# Patient Record
Sex: Male | Born: 1975
Health system: Southern US, Community
[De-identification: ages and names within clinical notes are randomized; demographics above are authoritative.]

## PROBLEM LIST (undated history)

## (undated) DIAGNOSIS — B1920 Unspecified viral hepatitis C without hepatic coma: Secondary | ICD-10-CM

## (undated) DIAGNOSIS — K219 Gastro-esophageal reflux disease without esophagitis: Secondary | ICD-10-CM

## (undated) DIAGNOSIS — N433 Hydrocele, unspecified: Secondary | ICD-10-CM

## (undated) DIAGNOSIS — K7460 Unspecified cirrhosis of liver: Secondary | ICD-10-CM

## (undated) HISTORY — PX: INGUINAL HERNIA REPAIR: SUR1180

## (undated) HISTORY — DX: Unspecified viral hepatitis C without hepatic coma: B19.20

## (undated) HISTORY — DX: Unspecified cirrhosis of liver: K74.60

---

## 2002-10-20 ENCOUNTER — Emergency Department (HOSPITAL_COMMUNITY): Admission: EM | Admit: 2002-10-20 | Discharge: 2002-10-20 | Payer: Self-pay | Admitting: Emergency Medicine

## 2002-10-21 ENCOUNTER — Emergency Department (HOSPITAL_COMMUNITY): Admission: EM | Admit: 2002-10-21 | Discharge: 2002-10-21 | Payer: Self-pay | Admitting: Emergency Medicine

## 2002-10-21 ENCOUNTER — Encounter: Payer: Self-pay | Admitting: Emergency Medicine

## 2004-07-22 ENCOUNTER — Emergency Department (HOSPITAL_COMMUNITY): Admission: EM | Admit: 2004-07-22 | Discharge: 2004-07-22 | Payer: Self-pay | Admitting: Emergency Medicine

## 2004-10-29 ENCOUNTER — Emergency Department (HOSPITAL_COMMUNITY): Admission: EM | Admit: 2004-10-29 | Discharge: 2004-10-29 | Payer: Self-pay | Admitting: Emergency Medicine

## 2004-11-02 ENCOUNTER — Emergency Department (HOSPITAL_COMMUNITY): Admission: EM | Admit: 2004-11-02 | Discharge: 2004-11-02 | Payer: Self-pay | Admitting: Emergency Medicine

## 2008-08-07 ENCOUNTER — Emergency Department (HOSPITAL_COMMUNITY): Admission: EM | Admit: 2008-08-07 | Discharge: 2008-08-07 | Payer: Self-pay | Admitting: Emergency Medicine

## 2008-08-10 ENCOUNTER — Emergency Department (HOSPITAL_COMMUNITY): Admission: EM | Admit: 2008-08-10 | Discharge: 2008-08-10 | Payer: Self-pay | Admitting: Family Medicine

## 2009-02-27 ENCOUNTER — Emergency Department (HOSPITAL_COMMUNITY): Admission: EM | Admit: 2009-02-27 | Discharge: 2009-02-27 | Payer: Self-pay | Admitting: Emergency Medicine

## 2009-06-26 ENCOUNTER — Emergency Department (HOSPITAL_COMMUNITY): Admission: EM | Admit: 2009-06-26 | Discharge: 2009-06-26 | Payer: Self-pay | Admitting: Emergency Medicine

## 2015-05-24 ENCOUNTER — Encounter (HOSPITAL_COMMUNITY): Payer: Self-pay

## 2015-05-24 ENCOUNTER — Emergency Department (HOSPITAL_COMMUNITY)
Admission: EM | Admit: 2015-05-24 | Discharge: 2015-05-24 | Disposition: A | Discharge status: Home / Self Care | Payer: Self-pay | Attending: Emergency Medicine | Admitting: Emergency Medicine

## 2015-05-24 DIAGNOSIS — Y939 Activity, unspecified: Secondary | ICD-10-CM | POA: Insufficient documentation

## 2015-05-24 DIAGNOSIS — S41112A Laceration without foreign body of left upper arm, initial encounter: Secondary | ICD-10-CM

## 2015-05-24 DIAGNOSIS — Y999 Unspecified external cause status: Secondary | ICD-10-CM | POA: Insufficient documentation

## 2015-05-24 DIAGNOSIS — W268XXA Contact with other sharp object(s), not elsewhere classified, initial encounter: Secondary | ICD-10-CM | POA: Insufficient documentation

## 2015-05-24 DIAGNOSIS — S51812A Laceration without foreign body of left forearm, initial encounter: Secondary | ICD-10-CM | POA: Insufficient documentation

## 2015-05-24 DIAGNOSIS — Y929 Unspecified place or not applicable: Secondary | ICD-10-CM | POA: Insufficient documentation

## 2015-05-24 DIAGNOSIS — Z720 Tobacco use: Secondary | ICD-10-CM | POA: Insufficient documentation

## 2015-05-24 MED ORDER — LIDOCAINE-EPINEPHRINE (PF) 2 %-1:200000 IJ SOLN
10.0000 mL | Freq: Once | INTRAMUSCULAR | Status: AC
  Administered 2015-05-24: 10 mL
  Filled 2015-05-24: qty 20
  Filled 2015-05-24: qty 10

## 2015-05-24 MED ORDER — TETANUS-DIPHTH-ACELL PERTUSSIS 5-2.5-18.5 LF-MCG/0.5 IM SUSP
0.5000 mL | Freq: Once | INTRAMUSCULAR | Status: AC
  Administered 2015-05-24: 0.5 mL via INTRAMUSCULAR
  Filled 2015-05-24: qty 0.5

## 2015-05-24 NOTE — ED Provider Notes (Signed)
CSN: [id number]     Arrival date & time 05/24/15  1234 History  By signing my name below, I, Justin Newman, attest that this documentation has been prepared under the direction and in the presence of non-physician practitioner, Starsky Nanna Camprubi-Soms, PA-C. Electronically Signed: [name], Scribe. 05/24/2015. 1:22 PM.   Chief Complaint  Patient presents with  . Extremity Laceration   Patient is a 40 y.o. male presenting with skin laceration. The history is provided by the patient. No language interpreter was used.  Laceration Location:  Shoulder/arm Shoulder/arm laceration location:  L forearm Depth:  Cutaneous Quality: straight   Bleeding: controlled   Time since incident:  3 hours Laceration mechanism:  Metal edge Pain details:    Quality:  Burning   Severity:  Mild   Timing:  Constant   Progression:  Unchanged Foreign body present:  No foreign bodies Relieved by:  None tried Worsened by:  Pressure Ineffective treatments:  None tried Tetanus status:  Unknown HPI Comments:  Justin Newman is a 40 y.o. otherwise healthy male who presents to the Emergency Department complaining of 3/10 constant, stinging nonradiating pain over 2 lacerations to left anterior forearm. Pain worsens with palpation to the area. Pt was pulling vines and caught his arm on barbed wire 2.5 hours ago. Denies fall. No alleviating factors noted or treatment attempted PTA. Bleeding controlled PTA with pressure. Pt reports reconstructive surgery on left arm in 2012. He is unsure of last Tetanus. Denies use of blood thinners or any bleeding conditions, numbness, tingling, focal weakness, chest pain, shortness of breath, nausea, vomiting, abdominal pain, dysuria, hematuria, rash, body aches, or muscle pain.  History reviewed. No pertinent past medical history. Past Surgical History  Procedure Laterality Date  . Hernia repair     History reviewed. No pertinent family history. Social History  Substance Use  Topics  . Smoking status: Current Every Day Smoker  . Smokeless tobacco: None  . Alcohol Use: No    Review of Systems  Respiratory: Negative for shortness of breath.   Cardiovascular: Negative for chest pain.  Gastrointestinal: Negative for nausea, vomiting and abdominal pain.  Genitourinary: Negative for dysuria and hematuria.  Musculoskeletal: Positive for myalgias (at wounds on L arm). Negative for arthralgias.  Skin: Positive for wound (laceration). Negative for color change and rash.  Allergic/Immunologic: Negative for immunocompromised state.  Neurological: Negative for weakness and numbness.  Hematological: Does not bruise/bleed easily.  Psychiatric/Behavioral: Negative for confusion.  10 Systems reviewed and all are negative for acute change except as noted in the HPI.  Allergies  Review of patient's allergies indicates no known allergies.  Home Medications   Prior to Admission medications   Not on File   BP 127/90 mmHg  Pulse 78  Temp(Src) 98.9 F (37.2 C) (Oral)  Resp 16  SpO2 97%   Physical Exam  Constitutional: He is oriented to person, place, and time. Vital signs are normal. He appears well-developed and well-nourished.  Non-toxic appearance. No distress.  Afebrile, nontoxic, NAD  HENT:  Head: Normocephalic and atraumatic.  Mouth/Throat: Mucous membranes are normal.  Eyes: Conjunctivae and EOM are normal. Right eye exhibits no discharge. Left eye exhibits no discharge.  Neck: Normal range of motion. Neck supple.  Cardiovascular: Normal rate and intact distal pulses.   Pulmonary/Chest: Effort normal. No respiratory distress.  Abdominal: Normal appearance. He exhibits no distension.  Musculoskeletal: Normal range of motion.       Left forearm: He exhibits laceration. He exhibits no tenderness, no bony  tenderness and no swelling.  Left wrist, elbow and hand with FROM intact, no focal bony tenderness to the left upper extremity, with 2 lacerations superficial  to volar aspect of forearm as pictured below. No retained foreign bodies, no ongoing bleeding. Strength and sensation grossly intact, distal puses intact, soft compartments.  Neurological: He is alert and oriented to person, place, and time. He has normal strength. No sensory deficit.  Skin: Skin is warm and dry. Laceration noted. No rash noted.  2 left arm lacs as noted above and picture below   Psychiatric: He has a normal mood and affect.  Nursing note and vitals reviewed.     ED Course  .Marland KitchenLaceration Repair Date/Time: 05/24/2015 1:34 PM Performed by: Zacarias Pontes Authorized by: Zacarias Pontes Consent: Verbal consent obtained. Risks and benefits: risks, benefits and alternatives were discussed Consent given by: patient Patient understanding: patient states understanding of the procedure being performed Patient consent: the patient's understanding of the procedure matches consent given Patient identity confirmed: verbally with patient Body area: upper extremity Location details: left lower arm Wound length (cm): one lac ~3cm, one lac ~1.5cm. Foreign bodies: no foreign bodies Tendon involvement: none Nerve involvement: none Vascular damage: no Local anesthetic: lidocaine 2% with epinephrine Anesthetic total: 2 ml Patient sedated: no Preparation: Patient was prepped and draped in the usual sterile fashion. Irrigation solution: saline Irrigation method: syringe Amount of cleaning: extensive Debridement: none Degree of undermining: none Skin closure: 5-0 Prolene Number of sutures: 6 Technique: simple and horizontal mattress Approximation: close Approximation difficulty: simple Dressing: 4x4 sterile gauze and antibiotic ointment Patient tolerance: Patient tolerated the procedure well with no immediate complications    DIAGNOSTIC STUDIES:  Oxygen Saturation is 97% on RA, normal by my interpretation.    COORDINATION OF CARE:  1:10 PM Will update  Tetanus and set up for laceration repair. Discussed treatment plan with pt at bedside and pt agreed to plan.  1:34 PM Laceration repair: 2 mL lidocaine; 5 o'prolene; 6 total, 4 and 2  Labs Review Labs Reviewed - No data to display  Imaging Review No results found. I have personally reviewed and evaluated these images and lab results as part of my medical decision-making.   EKG Interpretation None      MDM   Final diagnoses:  Arm laceration, left, initial encounter  Tobacco use    40 y.o. male here with superficial lacs to L forearm. NVI with soft compartments, FROM intact in extremity distal to the wounds. No FBs visualized, and wound bed easily examined. Doubt need for xray imaging. Will update tetanus since he's not sure when his last one was. Will repair. Declines wanting pain meds.   1:50 PM Suture repair done, one lac required 1 horizontal mattress suture and 3 simple interrupted sutures; other lac required 2 simple interrupted sutures. Wound edges well approximated. Discussed proper wound care. Smoking cessation encouraged. Doubt need for empiric abx. F/up with PCP or [facility] in 7-10 days for suture removal and recheck of wound. RICE and tylenol/motrin for pain. I explained the diagnosis and have given explicit precautions to return to the ER including for any other new or worsening symptoms. The patient understands and accepts the medical plan as it's been dictated and I have answered their questions. Discharge instructions concerning home care and prescriptions have been given. The patient is STABLE and is discharged to home in good condition.   I personally performed the services described in this documentation, which was scribed in my presence. The  recorded information has been reviewed and is accurate.  BP 127/90 mmHg  Pulse 78  Temp(Src) 98.9 F (37.2 C) (Oral)  Resp 16  SpO2 97%  Meds ordered this encounter  Medications  . lidocaine-EPINEPHrine (XYLOCAINE W/EPI) 2  %-1:200000 (PF) injection 10 mL    Sig:   . Tdap (BOOSTRIX) injection 0.5 mL    Sig:      Tonga Prout Camprubi-Soms, PA-C 05/24/15 [place], MD 05/30/15 [phone]

## 2015-05-24 NOTE — Progress Notes (Addendum)
Pt has two lacerations to the inner aspect of his left forearm. Both wounds irrigated with NSS and 4X4 's placed. Pt tolerated. Positive radial pulse and pt has full ROM of his arm. Lacerations about 6 inches in length. Minimal exudate from both sites. PA in  With the pt to suture. (1:32pm )

## 2015-05-24 NOTE — Discharge Instructions (Signed)
Keep wound clean with mild soap and water. Keep area covered with a topical antibiotic ointment and bandage, keep bandage dry, and do not submerge in water for 24 hours. Ice and elevate for additional pain relief and swelling. Alternate between Ibuprofen and Tylenol for additional pain relief. STOP SMOKING! Follow up with your primary care doctor or the [facility] [place] in approximately 7-10 days for wound recheck and suture removal. Monitor area for signs of infection to include, but not limited to: increasing pain, spreading redness, drainage/pus, worsening swelling, or fevers. Return to emergency department for emergent changing or worsening symptoms.    Laceration Care, Adult A laceration is a cut that goes through all layers of the skin. The cut also goes into the tissue that is right under the skin. Some cuts heal on their own. Others need to be closed with stitches (sutures), staples, skin adhesive strips, or wound glue. Taking care of your cut lowers your risk of infection and helps your cut to heal better. HOW TO TAKE CARE OF YOUR CUT For stitches or staples:  Keep the wound clean and dry.  If you were given a bandage (dressing), you should change it at least one time per day or as told by your doctor. You should also change it if it gets wet or dirty.  Keep the wound completely dry for the first 24 hours or as told by your doctor. After that time, you may take a shower or a bath. However, make sure that the wound is not soaked in water until after the stitches or staples have been removed.  Clean the wound one time each day or as told by your doctor:  Wash the wound with soap and water.  Rinse the wound with water until all of the soap comes off.  Pat the wound dry with a clean towel. Do not rub the wound.  After you clean the wound, put a thin layer of antibiotic ointment on it as told by your doctor. This ointment:  Helps to prevent infection.  Keeps the bandage  from sticking to the wound.  Have your stitches or staples removed as told by your doctor. If your doctor used skin adhesive strips:   Keep the wound clean and dry.  If you were given a bandage, you should change it at least one time per day or as told by your doctor. You should also change it if it gets dirty or wet.  Do not get the skin adhesive strips wet. You can take a shower or a bath, but be careful to keep the wound dry.  If the wound gets wet, pat it dry with a clean towel. Do not rub the wound.  Skin adhesive strips fall off on their own. You can trim the strips as the wound heals. Do not remove any strips that are still stuck to the wound. They will fall off after a while. If your doctor used wound glue:  Try to keep your wound dry, but you may briefly wet it in the shower or bath. Do not soak the wound in water, such as by swimming.  After you take a shower or a bath, gently pat the wound dry with a clean towel. Do not rub the wound.  Do not do any activities that will make you really sweaty until the skin glue has fallen off on its own.  Do not apply liquid, cream, or ointment medicine to your wound while the skin glue is  still on.  If you were given a bandage, you should change it at least one time per day or as told by your doctor. You should also change it if it gets dirty or wet.  If a bandage is placed over the wound, do not let the tape for the bandage touch the skin glue.  Do not pick at the glue. The skin glue usually stays on for 5-10 days. Then, it falls off of the skin. General Instructions  To help prevent scarring, make sure to cover your wound with sunscreen whenever you are outside after stitches are removed, after adhesive strips are removed, or when wound glue stays in place and the wound is healed. Make sure to wear a sunscreen of at least 30 SPF.  Take over-the-counter and prescription medicines only as told by your doctor.  If you were given  antibiotic medicine or ointment, take or apply it as told by your doctor. Do not stop using the antibiotic even if your wound is getting better.  Do not scratch or pick at the wound.  Keep all follow-up visits as told by your doctor. This is important.  Check your wound every day for signs of infection. Watch for:  Redness, swelling, or pain.  Fluid, blood, or pus.  Raise (elevate) the injured area above the level of your heart while you are sitting or lying down, if possible. GET HELP IF:  You got a tetanus shot and you have any of these problems at the injection site:  Swelling.  Very bad pain.  Redness.  Bleeding.  You have a fever.  A wound that was closed breaks open.  You notice a bad smell coming from your wound or your bandage.  You notice something coming out of the wound, such as wood or glass.  Medicine does not help your pain.  You have more redness, swelling, or pain at the site of your wound.  You have fluid, blood, or pus coming from your wound.  You notice a change in the color of your skin near your wound.  You need to change the bandage often because fluid, blood, or pus is coming from the wound.  You start to have a new rash.  You start to have numbness around the wound. GET HELP RIGHT AWAY IF:  You have very bad swelling around the wound.  Your pain suddenly gets worse and is very bad.  You notice painful lumps near the wound or on skin that is anywhere on your body.  You have a red streak going away from your wound.  The wound is on your hand or foot and you cannot move a finger or toe like you usually can.  The wound is on your hand or foot and you notice that your fingers or toes look pale or bluish.   This information is not intended to replace advice given to you by your health care provider. Make sure you discuss any questions you have with your health care provider.   Document Released: 06/10/2007 Document Revised: 05/08/2014  Document Reviewed: 12/18/2013 Elsevier Interactive Patient Education 2016 [place], or Adhesive Wound Closure Doctors use stitches (sutures), staples, and certain glue (skin adhesives) to hold your skin together while it heals (wound closure). You may need this treatment after you have surgery or if you cut your skin accidentally. These methods help your skin heal more quickly. They also make it less likely that you will have a scar. WHAT ARE  THE DIFFERENT KINDS OF WOUND CLOSURES? There are many options for wound closure. The one that your doctor uses depends on how deep and large your wound is. Adhesive Glue To use this glue to close a wound, your doctor holds the edges of the wound together and paints the glue on the surface of your skin. You may need more than one layer of glue. Then the wound may be covered with a light bandage (dressing). This type of skin closure may be used for small wounds that are not deep (superficial). Using glue for wound closure is less painful than other methods. It does not require a medicine that numbs the area. This method also leaves nothing to be removed. Adhesive glue is often used for children and on facial wounds. Adhesive glue cannot be used for wounds that are deep, uneven, or bleeding. It is not used inside of a wound.  Adhesive Strips These strips are made of sticky (adhesive), porous paper. They are placed across your skin edges like a regular adhesive bandage. You leave them on until they fall off. Adhesive strips may be used to close very superficial wounds. They may also be used along with sutures to improve closure of your skin edges.  Sutures Sutures are the oldest method of wound closure. Sutures can be made from natural or synthetic materials. They can be made from a material that your body can break down as your wound heals (absorbable), or they can be made from a material that needs to be removed from your skin  (nonabsorbable). They come in many different strengths and sizes. Your doctor attaches the sutures to a steel needle on one end. Sutures can be passed through your skin, or through the tissues beneath your skin. Then they are tied and cut. Your skin edges may be closed in one continuous stitch or in separate stitches. Sutures are strong and can be used for all kinds of wounds. Absorbable sutures may be used to close tissues under the skin. The disadvantage of sutures is that they may cause skin reactions that lead to infection. Nonabsorbable sutures need to be removed. Staples When surgical staples are used to close a wound, the edges of your skin on both sides of the wound are brought close together. A staple is placed across the wound, and an instrument secures the edges together. Staples are often used to close surgical cuts (incisions). Staples are faster to use than sutures, and they cause less reaction from your skin. Staples need to be removed using a tool that bends the staples away from your skin. HOW DO I CARE FOR MY WOUND CLOSURE?  Take medicines only as told by your doctor.  If you were prescribed an antibiotic medicine for your wound, finish it all even if you start to feel better.  Use ointments or creams only as told by your doctor.  Wash your hands with soap and water before and after touching your wound.  Do not soak your wound in water. Do not take baths, swim, or use a hot tub until your doctor says it is okay.  Ask your doctor when you can start showering. Cover your wound if told by your doctor.  Do not take out your own sutures or staples.  Do not pick at your wound. Picking can cause an infection.  Keep all follow-up visits as told by your doctor. This is important. HOW LONG WILL I HAVE MY WOUND CLOSURE?   Leave adhesive glue on your skin until  the glue peels away.  Leave adhesive strips on your skin until they fall off.  Absorbable sutures will dissolve within  several days.  Nonabsorbable sutures and staples must be removed. The location of the wound will determine how long they stay in. This can range from several days to a couple of weeks. WHEN SHOULD I SEEK HELP FOR MY WOUND CLOSURE? Contact your doctor if:  You have a fever.  You have chills.  You have redness, puffiness (swelling), or pain at the site of your wound.  You have fluid, blood, or pus coming from your wound.  There is a bad smell coming from your wound.  The skin edges of your wound start to separate after your sutures have been removed.  Your wound becomes thick, raised, and darker in color after your sutures come out (scarring).   This information is not intended to replace advice given to you by your health care provider. Make sure you discuss any questions you have with your health care provider.   Document Released: 10/19/2008 Document Revised: 01/12/2014 Document Reviewed: 05/31/2013 Elsevier Interactive Patient Education 2016 [place] Sutures are stitches that can be used to close wounds. Taking care of your wound properly can help prevent pain and infection. It can also help your wound to heal more quickly. HOW TO CARE FOR YOUR SUTURED WOUND Wound Care  Keep the wound clean and dry.  If you were given a bandage (dressing), change it at least one time per day or as told by your doctor. You should also change it if it gets wet or dirty.  Keep the wound completely dry for the first 24 hours or as told by your doctor. After that time, you may shower or bathe. However, make sure that the wound is not soaked in water until the sutures have been removed.  Clean the wound one time each day or as told by your doctor.  Wash the wound with soap and water.  Rinse the wound with water to remove all soap.  Pat the wound dry with a clean towel. Do not rub the wound.  After cleaning the wound, put a thin layer of antibiotic ointment on it as  told your doctor. This ointment:  Helps to prevent infection.  Keeps the bandage from sticking to the wound.  Have the sutures removed as told by your doctor. General Instructions  Take or apply medicines only as told by your doctor.  To help prevent scarring, make sure to cover your wound with sunscreen whenever you are outside after the sutures are removed and the wound is healed. Make sure to wear a sunscreen of at least 30 SPF.  If you were prescribed an antibiotic medicine or ointment, finish all of it even if you start to feel better.  Do not scratch or pick at the wound.  Keep all follow-up visits as told by your doctor. This is important.  Check your wound every day for signs of infection. Watch for:  Redness, swelling, or pain.  Fluid, blood, or pus.  Raise (elevate) the injured area above the level of your heart while you are sitting or lying down, if possible.  Avoid stretching your wound.  Drink enough fluids to keep your pee (urine) clear or pale yellow. GET HELP IF:  You were given a tetanus shot and you have any of these where the needle went in:  Swelling.  Very bad pain.  Redness.  Bleeding.  You have  a fever.  A wound that was closed breaks open.  You notice a bad smell coming from the wound.  You notice something coming out of the wound, such as wood or glass.  Medicine does not help your pain.  You have any of these at the site of the wound.  More redness.  More swelling.  More pain.  You have any of these coming from the wound.  Fluid.  Blood.  Pus.  You notice a change in the color of your skin near the wound.  You need to change the bandage often due to fluid, blood, or pus coming from the wound.  You have a new rash.  You have numbness around the wound. GET HELP RIGHT AWAY IF:  You have very bad swelling around the wound.  Your pain suddenly gets worse and is very bad.  You have painful lumps near the wound or on  skin that is anywhere on your body.  You have a red streak going away from the wound.  The wound is on your hand or foot and you cannot move a finger or toe like normal.  The wound is on your hand or foot and you notice that your fingers or toes look pale or bluish.   This information is not intended to replace advice given to you by your health care provider. Make sure you discuss any questions you have with your health care provider.   Document Released: 06/10/2007 Document Revised: 05/08/2014 Document Reviewed: 08/03/2012 Elsevier Interactive Patient Education 2016 [employer].  Smoking Cessation, Tips for Success If you are ready to quit smoking, congratulations! You have chosen to help yourself be healthier. Cigarettes bring nicotine, tar, carbon monoxide, and other irritants into your body. Your lungs, heart, and blood vessels will be able to work better without these poisons. There are many different ways to quit smoking. Nicotine gum, nicotine patches, a nicotine inhaler, or nicotine nasal spray can help with physical craving. Hypnosis, support groups, and medicines help break the habit of smoking. WHAT THINGS CAN I DO TO MAKE QUITTING EASIER?  Here are some tips to help you quit for good:  Pick a date when you will quit smoking completely. Tell all of your friends and family about your plan to quit on that date.  Do not try to slowly cut down on the number of cigarettes you are smoking. Pick a quit date and quit smoking completely starting on that day.  Throw away all cigarettes.   Clean and remove all ashtrays from your home, work, and car.  On a card, write down your reasons for quitting. Carry the card with you and read it when you get the urge to smoke.  Cleanse your body of nicotine. Drink enough water and fluids to keep your urine clear or pale yellow. Do this after quitting to flush the nicotine from your body.  Learn to predict your moods. Do not let a bad situation  be your excuse to have a cigarette. Some situations in your life might tempt you into wanting a cigarette.  Never have "just one" cigarette. It leads to wanting another and another. Remind yourself of your decision to quit.  Change habits associated with smoking. If you smoked while driving or when feeling stressed, try other activities to replace smoking. Stand up when drinking your coffee. Brush your teeth after eating. Sit in a different chair when you read the paper. Avoid alcohol while trying to quit, and try to drink fewer  caffeinated beverages. Alcohol and caffeine may urge you to smoke.  Avoid foods and drinks that can trigger a desire to smoke, such as sugary or spicy foods and alcohol.  Ask people who smoke not to smoke around you.  Have something planned to do right after eating or having a cup of coffee. For example, plan to take a walk or exercise.  Try a relaxation exercise to calm you down and decrease your stress. Remember, you may be tense and nervous for the first 2 weeks after you quit, but this will pass.  Find new activities to keep your hands busy. Play with a pen, coin, or rubber band. Doodle or draw things on paper.  Brush your teeth right after eating. This will help cut down on the craving for the taste of tobacco after meals. You can also try mouthwash.   Use oral substitutes in place of cigarettes. Try using lemon drops, carrots, cinnamon sticks, or chewing gum. Keep them handy so they are available when you have the urge to smoke.  When you have the urge to smoke, try deep breathing.  Designate your home as a nonsmoking area.  If you are a heavy smoker, ask your health care provider about a prescription for nicotine chewing gum. It can ease your withdrawal from nicotine.  Reward yourself. Set aside the cigarette money you save and buy yourself something nice.  Look for support from others. Join a support group or smoking cessation program. Ask someone at home  or at work to help you with your plan to quit smoking.  Always ask yourself, "Do I need this cigarette or is this just a reflex?" Tell yourself, "Today, I choose not to smoke," or "I do not want to smoke." You are reminding yourself of your decision to quit.  Do not replace cigarette smoking with electronic cigarettes (commonly called e-cigarettes). The safety of e-cigarettes is unknown, and some may contain harmful chemicals.  If you relapse, do not give up! Plan ahead and think about what you will do the next time you get the urge to smoke. HOW WILL I FEEL WHEN I QUIT SMOKING? You may have symptoms of withdrawal because your body is used to nicotine (the addictive substance in cigarettes). You may crave cigarettes, be irritable, feel very hungry, cough often, get headaches, or have difficulty concentrating. The withdrawal symptoms are only temporary. They are strongest when you first quit but will go away within 10-14 days. When withdrawal symptoms occur, stay in control. Think about your reasons for quitting. Remind yourself that these are signs that your body is healing and getting used to being without cigarettes. Remember that withdrawal symptoms are easier to treat than the major diseases that smoking can cause.  Even after the withdrawal is over, expect periodic urges to smoke. However, these cravings are generally short lived and will go away whether you smoke or not. Do not smoke! WHAT RESOURCES ARE AVAILABLE TO HELP ME QUIT SMOKING? Your health care provider can direct you to community resources or hospitals for support, which may include:  Group support.  Education.  Hypnosis.  Therapy.   This information is not intended to replace advice given to you by your health care provider. Make sure you discuss any questions you have with your health care provider.   Document Released: 09/20/2003 Document Revised: 01/12/2014 Document Reviewed: 06/09/2012 Elsevier Interactive Patient  Education [employer].

## 2015-05-24 NOTE — ED Notes (Signed)
2 lacerations to left anterior forearm.  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  ? Tetanus in 2012

## 2015-06-27 ENCOUNTER — Emergency Department (HOSPITAL_COMMUNITY)
Admission: EM | Admit: 2015-06-27 | Discharge: 2015-06-27 | Disposition: A | Discharge status: Home / Self Care | Payer: MEDICAID | Attending: Dermatology | Admitting: Dermatology

## 2015-06-27 ENCOUNTER — Encounter (HOSPITAL_COMMUNITY): Payer: Self-pay | Admitting: Nurse Practitioner

## 2015-06-27 DIAGNOSIS — N50812 Left testicular pain: Secondary | ICD-10-CM | POA: Insufficient documentation

## 2015-06-27 DIAGNOSIS — Z5321 Procedure and treatment not carried out due to patient leaving prior to being seen by health care provider: Secondary | ICD-10-CM | POA: Insufficient documentation

## 2015-06-27 DIAGNOSIS — F17200 Nicotine dependence, unspecified, uncomplicated: Secondary | ICD-10-CM | POA: Insufficient documentation

## 2015-06-27 NOTE — ED Notes (Signed)
Pt c/o 1 month history of L testicular pain and swelling, increasingly worse since onset. Denies urinary changes, n/v, fevers, penile discharge. He was seen at health dept and tested for STDs 6/9, told to come here due to "low white blood cells" on lab results from health department

## 2015-08-08 ENCOUNTER — Ambulatory Visit: Payer: Self-pay | Admitting: Family Medicine

## 2015-09-04 ENCOUNTER — Emergency Department (HOSPITAL_COMMUNITY)
Admission: EM | Admit: 2015-09-04 | Discharge: 2015-09-04 | Disposition: A | Discharge status: Home / Self Care | Payer: Self-pay | Attending: Emergency Medicine | Admitting: Emergency Medicine

## 2015-09-04 ENCOUNTER — Encounter (HOSPITAL_COMMUNITY): Payer: Self-pay | Admitting: *Deleted

## 2015-09-04 ENCOUNTER — Emergency Department (HOSPITAL_COMMUNITY): Payer: Self-pay

## 2015-09-04 DIAGNOSIS — F17200 Nicotine dependence, unspecified, uncomplicated: Secondary | ICD-10-CM | POA: Insufficient documentation

## 2015-09-04 DIAGNOSIS — N5089 Other specified disorders of the male genital organs: Secondary | ICD-10-CM

## 2015-09-04 DIAGNOSIS — N433 Hydrocele, unspecified: Secondary | ICD-10-CM | POA: Insufficient documentation

## 2015-09-04 LAB — COMPREHENSIVE METABOLIC PANEL
ALT: 49 U/L (ref 17–63)
AST: 33 U/L (ref 15–41)
Albumin: 4.3 g/dL (ref 3.5–5.0)
Alkaline Phosphatase: 76 U/L (ref 38–126)
Anion gap: 7 (ref 5–15)
BUN: 16 mg/dL (ref 6–20)
CHLORIDE: 107 mmol/L (ref 101–111)
CO2: 26 mmol/L (ref 22–32)
CREATININE: 0.92 mg/dL (ref 0.61–1.24)
Calcium: 9.6 mg/dL (ref 8.9–10.3)
GFR calc Af Amer: >60 mL/min (ref >60)
Glucose, Bld: 105 mg/dL — ABNORMAL HIGH (ref 65–99)
POTASSIUM: 4.1 mmol/L (ref 3.5–5.1)
SODIUM: 140 mmol/L (ref 135–145)
Total Bilirubin: 0.9 mg/dL (ref 0.3–1.2)
Total Protein: 7.4 g/dL (ref 6.5–8.1)

## 2015-09-04 LAB — URINALYSIS, ROUTINE W REFLEX MICROSCOPIC
Bilirubin Urine: NEGATIVE
GLUCOSE, UA: NEGATIVE mg/dL
HGB URINE DIPSTICK: NEGATIVE
Ketones, ur: NEGATIVE mg/dL
LEUKOCYTES UA: NEGATIVE
Nitrite: NEGATIVE
PH: 6 (ref 5.0–8.0)
Protein, ur: NEGATIVE mg/dL
Specific Gravity, Urine: 1.029 (ref 1.005–1.030)

## 2015-09-04 LAB — CBC
HEMATOCRIT: 45.8 % (ref 39.0–52.0)
Hemoglobin: 16 g/dL (ref 13.0–17.0)
MCH: 32.5 pg (ref 26.0–34.0)
MCHC: 34.9 g/dL (ref 30.0–36.0)
MCV: 93.1 fL (ref 78.0–100.0)
PLATELETS: 346 10*3/uL (ref 150–400)
RBC: 4.92 MIL/uL (ref 4.22–5.81)
RDW: 13.2 % (ref 11.5–15.5)
WBC: 10.2 10*3/uL (ref 4.0–10.5)

## 2015-09-04 LAB — LIPASE, BLOOD: LIPASE: 26 U/L (ref 11–51)

## 2015-09-04 MED ORDER — HYDROMORPHONE HCL 1 MG/ML IJ SOLN
0.5000 mg | Freq: Once | INTRAMUSCULAR | Status: AC
  Administered 2015-09-04: 0.5 mg via INTRAVENOUS
  Filled 2015-09-04: qty 1

## 2015-09-04 MED ORDER — OXYCODONE-ACETAMINOPHEN 5-325 MG PO TABS: 1.0000 | ORAL_TABLET | Freq: Four times a day (QID) | ORAL | 0 refills | Status: DC | PRN

## 2015-09-04 MED ORDER — ONDANSETRON HCL 4 MG/2ML IJ SOLN
4.0000 mg | Freq: Once | INTRAMUSCULAR | Status: AC
  Administered 2015-09-04: 4 mg via INTRAVENOUS
  Filled 2015-09-04: qty 2

## 2015-09-04 MED ORDER — SODIUM CHLORIDE 0.9 % IV BOLUS (SEPSIS)
1000.0000 mL | Freq: Once | INTRAVENOUS | Status: AC
  Administered 2015-09-04: 1000 mL via INTRAVENOUS

## 2015-09-04 MED ORDER — ONDANSETRON 4 MG PO TBDP: 4.0000 mg | ORAL_TABLET | Freq: Three times a day (TID) | ORAL | 0 refills | Status: DC | PRN

## 2015-09-04 MED ORDER — LORAZEPAM 2 MG/ML IJ SOLN
1.0000 mg | Freq: Once | INTRAMUSCULAR | Status: AC
  Administered 2015-09-04: 1 mg via INTRAVENOUS
  Filled 2015-09-04: qty 1

## 2015-09-04 MED ORDER — OXYCODONE-ACETAMINOPHEN 5-325 MG PO TABS
2.0000 | ORAL_TABLET | Freq: Once | ORAL | Status: AC
  Administered 2015-09-04: 2 via ORAL
  Filled 2015-09-04: qty 2

## 2015-09-04 NOTE — ED Notes (Signed)
Pt stable, ambulatory, states understanding of discharge instructions 

## 2015-09-04 NOTE — ED Notes (Signed)
Patient transported to Ultrasound at this time via ED stretcher. Pt in no apparent distress at this time.
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
## 2015-09-04 NOTE — ED Triage Notes (Signed)
The pt is c/o abd pain and testicle pain and swelling for 2 months.  No urinary symptoms.

## 2015-09-04 NOTE — ED Notes (Signed)
Pt returned from [place] at this time.  Pt in no apparent distress at this time.  Will continue to closely monitor pt.

## 2015-10-07 ENCOUNTER — Other Ambulatory Visit: Payer: Self-pay | Admitting: Urology

## 2015-10-07 NOTE — Patient Instructions (Signed)
Justin Newman  10/07/2015   Your procedure is scheduled on: 10/15/2015    Report to [facility] Main  Entrance take [facility]  elevators to 3rd floor to  [place] at    1045 AM.  Call this number if you have problems the morning of surgery [phone]   Remember: ONLY 1 PERSON MAY GO WITH YOU TO SHORT STAY TO GET  READY MORNING OF [place].  Do not eat food or drink liquids :After Midnight.     Take these medicines the morning of surgery with A SIP OF WATER: none                                 You may not have any metal on your body including hair pins and              piercings  Do not wear jewelry, , lotions, powders or perfumes, deodorant              Men may shave face and neck.   Do not bring valuables to the hospital. [place].  Contacts, dentures or bridgework may not be worn into surgery.      Patients discharged the day of surgery will not be allowed to drive home.  Name and phone number of your driver:  Special Instructions: N/A              Please read over the following fact sheets you were given: _____________________________________________________________________             [facility] - Preparing for Surgery Before surgery, you can play an important role.  Because skin is not sterile, your skin needs to be as free of germs as possible.  You can reduce the number of germs on your skin by washing with CHG (chlorahexidine gluconate) soap before surgery.  CHG is an antiseptic cleaner which kills germs and bonds with the skin to continue killing germs even after washing. Please DO NOT use if you have an allergy to CHG or antibacterial soaps.  If your skin becomes reddened/irritated stop using the CHG and inform your nurse when you arrive at Short Stay. Do not shave (including legs and underarms) for at least 48 hours prior to the first CHG shower.  You may shave your  face/neck. Please follow these instructions carefully:  1.  Shower with CHG Soap the night before surgery and the  morning of Surgery.  2.  If you choose to wash your hair, wash your hair first as usual with your  normal  shampoo.  3.  After you shampoo, rinse your hair and body thoroughly to remove the  shampoo.                           4.  Use CHG as you would any other liquid soap.  You can apply chg directly  to the skin and wash                       Gently with a scrungie or clean washcloth.  5.  Apply the CHG Soap to your body ONLY FROM THE NECK DOWN.   Do  not use on face/ open                           Wound or open sores. Avoid contact with eyes, ears mouth and genitals (private parts).                       Wash face,  Genitals (private parts) with your normal soap.             6.  Wash thoroughly, paying special attention to the area where your surgery  will be performed.  7.  Thoroughly rinse your body with warm water from the neck down.  8.  DO NOT shower/wash with your normal soap after using and rinsing off  the CHG Soap.                9.  Pat yourself dry with a clean towel.            10.  Wear clean pajamas.            11.  Place clean sheets on your bed the night of your first shower and do not  sleep with pets. Day of Surgery : Do not apply any lotions/deodorants the morning of surgery.  Please wear clean clothes to the hospital/surgery center.  FAILURE TO FOLLOW THESE INSTRUCTIONS MAY RESULT IN THE CANCELLATION OF YOUR SURGERY PATIENT SIGNATURE_________________________________  NURSE SIGNATURE__________________________________  ________________________________________________________________________

## 2015-10-07 NOTE — Progress Notes (Signed)
Pt is being scheduled for preop appt; please place surgical orders in epic. Thanks.  

## 2015-10-08 ENCOUNTER — Encounter (HOSPITAL_COMMUNITY): Payer: Self-pay

## 2015-10-08 ENCOUNTER — Encounter (HOSPITAL_COMMUNITY)
Admission: RE | Admit: 2015-10-08 | Discharge: 2015-10-08 | Disposition: A | Discharge status: Home / Self Care | Payer: Self-pay | Source: Ambulatory Visit | Attending: Urology | Admitting: Urology

## 2015-10-08 ENCOUNTER — Encounter (INDEPENDENT_AMBULATORY_CARE_PROVIDER_SITE_OTHER): Payer: Self-pay

## 2015-10-08 DIAGNOSIS — Z01818 Encounter for other preprocedural examination: Secondary | ICD-10-CM | POA: Insufficient documentation

## 2015-10-08 HISTORY — DX: Gastro-esophageal reflux disease without esophagitis: K21.9

## 2015-10-08 LAB — CBC
HCT: 44.3 % (ref 39.0–52.0)
HEMOGLOBIN: 15.3 g/dL (ref 13.0–17.0)
MCH: 31.9 pg (ref 26.0–34.0)
MCHC: 34.5 g/dL (ref 30.0–36.0)
MCV: 92.5 fL (ref 78.0–100.0)
PLATELETS: 384 10*3/uL (ref 150–400)
RBC: 4.79 MIL/uL (ref 4.22–5.81)
RDW: 13 % (ref 11.5–15.5)
WBC: 9.2 10*3/uL (ref 4.0–10.5)

## 2015-10-14 NOTE — Progress Notes (Signed)
Patient aware of date change with no change in time.
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
## 2015-10-18 ENCOUNTER — Ambulatory Visit (HOSPITAL_COMMUNITY): Payer: Self-pay | Admitting: Certified Registered"

## 2015-10-18 ENCOUNTER — Encounter (HOSPITAL_COMMUNITY): Payer: Self-pay | Admitting: *Deleted

## 2015-10-18 ENCOUNTER — Ambulatory Visit (HOSPITAL_COMMUNITY)
Admission: RE | Admit: 2015-10-18 | Discharge: 2015-10-18 | Disposition: A | Discharge status: Home / Self Care | Payer: Self-pay | Source: Ambulatory Visit | Attending: Urology | Admitting: Urology

## 2015-10-18 ENCOUNTER — Encounter (HOSPITAL_COMMUNITY)
Admission: RE | Disposition: A | Discharge status: Home / Self Care | Payer: Self-pay | Source: Ambulatory Visit | Attending: Urology

## 2015-10-18 DIAGNOSIS — N430 Encysted hydrocele: Secondary | ICD-10-CM | POA: Insufficient documentation

## 2015-10-18 DIAGNOSIS — F17210 Nicotine dependence, cigarettes, uncomplicated: Secondary | ICD-10-CM | POA: Insufficient documentation

## 2015-10-18 HISTORY — PX: HYDROCELE EXCISION: SHX482

## 2015-10-18 SURGERY — HYDROCELECTOMY
Anesthesia: General | Site: Scrotum | Laterality: Left

## 2015-10-18 MED ORDER — PROPOFOL 10 MG/ML IV BOLUS
INTRAVENOUS | Status: AC
  Filled 2015-10-18: qty 20

## 2015-10-18 MED ORDER — HYDROMORPHONE HCL 1 MG/ML IJ SOLN
0.2500 mg | INTRAMUSCULAR | Status: DC | PRN
  Administered 2015-10-18 (×3): 0.5 mg via INTRAVENOUS

## 2015-10-18 MED ORDER — DEXAMETHASONE SODIUM PHOSPHATE 10 MG/ML IJ SOLN
INTRAMUSCULAR | Status: AC
  Filled 2015-10-18: qty 1

## 2015-10-18 MED ORDER — OXYCODONE HCL 5 MG PO TABS
5.0000 mg | ORAL_TABLET | Freq: Once | ORAL | Status: AC
  Administered 2015-10-18: 5 mg via ORAL

## 2015-10-18 MED ORDER — FENTANYL CITRATE (PF) 100 MCG/2ML IJ SOLN
INTRAMUSCULAR | Status: AC
  Filled 2015-10-18: qty 2

## 2015-10-18 MED ORDER — HYDROMORPHONE HCL 1 MG/ML IJ SOLN
INTRAMUSCULAR | Status: AC
  Filled 2015-10-18: qty 1

## 2015-10-18 MED ORDER — ONDANSETRON HCL 4 MG/2ML IJ SOLN
INTRAMUSCULAR | Status: AC
  Filled 2015-10-18: qty 2

## 2015-10-18 MED ORDER — CEFAZOLIN SODIUM-DEXTROSE 2-4 GM/100ML-% IV SOLN
2.0000 g | INTRAVENOUS | Status: AC
  Administered 2015-10-18: 2 g via INTRAVENOUS
  Filled 2015-10-18 (×2): qty 100

## 2015-10-18 MED ORDER — MIDAZOLAM HCL 5 MG/5ML IJ SOLN
INTRAMUSCULAR | Status: DC | PRN
  Administered 2015-10-18: 2 mg via INTRAVENOUS

## 2015-10-18 MED ORDER — BUPIVACAINE HCL (PF) 0.25 % IJ SOLN
INTRAMUSCULAR | Status: DC | PRN
  Administered 2015-10-18: 10 mL

## 2015-10-18 MED ORDER — FENTANYL CITRATE (PF) 100 MCG/2ML IJ SOLN
INTRAMUSCULAR | Status: DC | PRN
  Administered 2015-10-18 (×4): 50 ug via INTRAVENOUS

## 2015-10-18 MED ORDER — KETOROLAC TROMETHAMINE 30 MG/ML IJ SOLN
INTRAMUSCULAR | Status: DC | PRN
  Administered 2015-10-18: 30 mg via INTRAVENOUS

## 2015-10-18 MED ORDER — MIDAZOLAM HCL 2 MG/2ML IJ SOLN
INTRAMUSCULAR | Status: AC
  Filled 2015-10-18: qty 2

## 2015-10-18 MED ORDER — ONDANSETRON HCL 4 MG/2ML IJ SOLN
INTRAMUSCULAR | Status: DC | PRN
  Administered 2015-10-18: 4 mg via INTRAVENOUS

## 2015-10-18 MED ORDER — OXYCODONE HCL 5 MG PO TABS
ORAL_TABLET | ORAL | Status: AC
  Filled 2015-10-18: qty 1

## 2015-10-18 MED ORDER — DEXAMETHASONE SODIUM PHOSPHATE 10 MG/ML IJ SOLN
INTRAMUSCULAR | Status: DC | PRN
  Administered 2015-10-18: 10 mg via INTRAVENOUS

## 2015-10-18 MED ORDER — PROPOFOL 10 MG/ML IV BOLUS
INTRAVENOUS | Status: DC | PRN
  Administered 2015-10-18: 200 mg via INTRAVENOUS

## 2015-10-18 MED ORDER — SULFAMETHOXAZOLE-TRIMETHOPRIM 800-160 MG PO TABS: 1.0000 | ORAL_TABLET | Freq: Two times a day (BID) | ORAL | 0 refills | Status: DC

## 2015-10-18 MED ORDER — BUPIVACAINE HCL (PF) 0.25 % IJ SOLN
INTRAMUSCULAR | Status: AC
  Filled 2015-10-18: qty 30

## 2015-10-18 MED ORDER — KETOROLAC TROMETHAMINE 30 MG/ML IJ SOLN
INTRAMUSCULAR | Status: AC
  Filled 2015-10-18: qty 1

## 2015-10-18 MED ORDER — LACTATED RINGERS IV SOLN
INTRAVENOUS | Status: DC
  Administered 2015-10-18: 13:00:00 via INTRAVENOUS

## 2015-10-18 SURGICAL SUPPLY — 35 items
ADH SKN CLS APL DERMABOND .7 (GAUZE/BANDAGES/DRESSINGS) ×2
BNDG GAUZE ELAST 4 BULKY (GAUZE/BANDAGES/DRESSINGS) ×4 IMPLANT
COVER SURGICAL LIGHT HANDLE (MISCELLANEOUS) ×4 IMPLANT
DERMABOND ADVANCED (GAUZE/BANDAGES/DRESSINGS) ×2
DERMABOND ADVANCED .7 DNX12 (GAUZE/BANDAGES/DRESSINGS) ×2 IMPLANT
DRAIN PENROSE 18X1/2 LTX STRL (DRAIN) ×1 IMPLANT
DRAIN PENROSE 18X1/4 LTX STRL (WOUND CARE) ×4 IMPLANT
DRAPE LAPAROTOMY T 98X78 PEDS (DRAPES) ×4 IMPLANT
ELECT REM PT RETURN 9FT ADLT (ELECTROSURGICAL) ×4
ELECTRODE REM PT RTRN 9FT ADLT (ELECTROSURGICAL) ×2 IMPLANT
GAUZE SPONGE 4X4 12PLY STRL (GAUZE/BANDAGES/DRESSINGS) ×1 IMPLANT
GLOVE BIO SURGEON STRL SZ8 (GLOVE) ×4 IMPLANT
GLOVE BIOGEL PI IND STRL 8 (GLOVE) ×2 IMPLANT
GLOVE BIOGEL PI INDICATOR 8 (GLOVE) ×2
GOWN STRL REUS W/TWL LRG LVL3 (GOWN DISPOSABLE) ×8 IMPLANT
KIT BASIN OR (CUSTOM PROCEDURE TRAY) ×4 IMPLANT
LIQUID BAND (GAUZE/BANDAGES/DRESSINGS) ×3 IMPLANT
MARKER SKIN DUAL TIP RULER LAB (MISCELLANEOUS) ×4 IMPLANT
NEEDLE HYPO 22GX1.5 SAFETY (NEEDLE) IMPLANT
NS IRRIG 1000ML POUR BTL (IV SOLUTION) ×3 IMPLANT
PACK GENERAL/GYN (CUSTOM PROCEDURE TRAY) ×4 IMPLANT
SPONGE LAP 4X18 X RAY DECT (DISPOSABLE) ×5 IMPLANT
SUPPORT SCROTAL LG STRP (MISCELLANEOUS) ×3 IMPLANT
SUPPORTER ATHLETIC LG (MISCELLANEOUS) ×1
SUT ETHILON 3 0 PS 1 (SUTURE) ×3 IMPLANT
SUT MNCRL AB 4-0 PS2 18 (SUTURE) ×4 IMPLANT
SUT VIC AB 2-0 SH 27 (SUTURE) ×4
SUT VIC AB 2-0 SH 27X BRD (SUTURE) ×2 IMPLANT
SUT VIC AB 3-0 SH 27 (SUTURE) ×4
SUT VIC AB 3-0 SH 27XBRD (SUTURE) ×1 IMPLANT
SUT VICRYL 0 TIES 12 18 (SUTURE) ×4 IMPLANT
SYR CONTROL 10ML LL (SYRINGE) IMPLANT
TOWEL OR 17X26 10 PK STRL BLUE (TOWEL DISPOSABLE) ×4 IMPLANT
TOWEL OR NON WOVEN STRL DISP B (DISPOSABLE) ×4 IMPLANT
WATER STERILE IRR 1500ML POUR (IV SOLUTION) IMPLANT

## 2015-10-18 NOTE — Progress Notes (Signed)
Pt fidgeting in bed and pulling at scrotal support. Site checked and noted that penrose drain noted to be out of patient. Dr Alyson Ingles made aware; no new orders at this time other than educate patient to expect drainage from that site.

## 2015-10-18 NOTE — Anesthesia Postprocedure Evaluation (Signed)
Anesthesia Post Note  Patient: Justin Newman  Procedure(s) Performed: Procedure(s) (LRB): HYDROCELECTOMY ADULT (Left)  Patient location during evaluation: PACU Anesthesia Type: General Level of consciousness: awake Pain management: pain level controlled Vital Signs Assessment: post-procedure vital signs reviewed and stable Respiratory status: spontaneous breathing Cardiovascular status: stable Anesthetic complications: no    Last Vitals:  Vitals:   10/18/15 1401 10/18/15 1415  BP: 139/89 (!) 136/95  Pulse: 71 67  Resp: 17 16  Temp: 36.3 C     Last Pain:  Vitals:   10/18/15 1415  TempSrc:   PainSc: 6                  EDWARDS,[name]

## 2015-10-18 NOTE — Op Note (Signed)
Preoperative diagnosis: Left hydrocele  Postoperative diagnosis: left hydrocele  Procedure: 1.. Left hydrocelectomy  Attending: Nicolette Bang, MD  Resident: Cleotis Lema, MD  Anesthesia: General  History of blood loss: Minimal  Antibiotics: ancef  Drains: 1/4 penrose  Specimens: 1.  Left hydrocele sac   Findings: large left hydrocele, 650cc fluid drained  Indications: Patient is a 40 year old male with a history of a left traumatic hydrocele that was growing in size and causing him pain with walking.  We discussed the treatment options including observation versus excision after discussing treatment options he to proceed with excision.   Procedure in detail: Prior to procedure consent was obtained.  Patient was brought to the operating room and a brief timeout was done to ensure correct patient, correct procedure, correct site.  General anesthesia was administered and patient was placed in supine position.  His genitalia was then prepped and draped in usual sterile fashion.  A 5 cm incision was made in the left hemiscrotum.  We dissected down to the tunica and then incised the tunica. A large hydrocele was encountered and drained. We then excised the hydrocele sac and then over sewed the edge with 2-0 Vicryl in a running fashion.  We then placed a 1/4" penrose drain in the left hemiscrotum through a separate incision. We then returned the testis to the left hemiscrotum and closed the overlying dartos with 3-0 vicryl in a running fashion. The skin was then closed with 4-0 monocryl in a running fashion. Dermabond was placed on the incision.  A dressing was then applied to the incision.  We then placed a scrotal fluff and this then concluded the procedure which was well tolerated by the patient.  Complications: None  Condition: Stable, extubated, transferred to PACU.  Plan: Patient is to be discharged home.  He is to follow up in 4 weeks for wound check. He is to remove the drain in 48  hours.

## 2015-10-18 NOTE — Transfer of Care (Signed)
Immediate Anesthesia Transfer of Care Note  Patient: Justin Newman  Procedure(s) Performed: Procedure(s): HYDROCELECTOMY ADULT (Left)  Patient Location: PACU  Anesthesia Type:General  Level of Consciousness:  sedated, patient cooperative and responds to stimulation  Airway & Oxygen Therapy:Patient Spontanous Breathing and Patient connected to face mask oxgen  Post-op Assessment:  Report given to PACU RN and Post -op Vital signs reviewed and stable  Post vital signs:  Reviewed and stable  Last Vitals:  Vitals:   10/18/15 1045  BP: 122/75  Pulse: 72  Resp: 18  Temp: [id number] C    Complications: No apparent anesthesia complications

## 2015-10-18 NOTE — Anesthesia Preprocedure Evaluation (Addendum)
Anesthesia Evaluation  Patient identified by MRN, date of birth, ID band Patient awake    Reviewed: Allergy & Precautions, NPO status , Patient's Chart, lab work & pertinent test results  Airway Mallampati: II       Dental   Pulmonary Current Smoker,    breath sounds clear to auscultation       Cardiovascular negative cardio ROS   Rhythm:Regular Rate:Normal     Neuro/Psych    GI/Hepatic Neg liver ROS, GERD  ,  Endo/Other  negative endocrine ROS  Renal/GU      Musculoskeletal   Abdominal   Peds  Hematology   Anesthesia Other Findings   Reproductive/Obstetrics                            Anesthesia Physical Anesthesia Plan  ASA: II  Anesthesia Plan: General   Post-op Pain Management:    Induction: Intravenous  Airway Management Planned: LMA  Additional Equipment:   Intra-op Plan:   Post-operative Plan: Extubation in OR  Informed Consent: I have reviewed the patients History and Physical, chart, labs and discussed the procedure including the risks, benefits and alternatives for the proposed anesthesia with the patient or authorized representative who has indicated his/her understanding and acceptance.   Dental advisory given  Plan Discussed with: Anesthesiologist and CRNA  Anesthesia Plan Comments:         Anesthesia Quick Evaluation

## 2015-10-18 NOTE — Discharge Instructions (Signed)
General Anesthesia, Adult, Care After Refer to this sheet in the next few weeks. These instructions provide you with information on caring for yourself after your procedure. Your health care provider may also give you more specific instructions. Your treatment has been planned according to current medical practices, but problems sometimes occur. Call your health care provider if you have any problems or questions after your procedure. WHAT TO EXPECT AFTER THE PROCEDURE After the procedure, it is typical to experience:  Sleepiness.  Nausea and vomiting. HOME CARE INSTRUCTIONS  For the first 24 hours after general anesthesia:  Have a responsible person with you.  Do not drive a car. If you are alone, do not take public transportation.  Do not drink alcohol.  Do not take medicine that has not been prescribed by your health care provider.  Do not sign important papers or make important decisions.  You may resume a normal diet and activities as directed by your health care provider.  Change bandages (dressings) as directed.  If you have questions or problems that seem related to general anesthesia, call the hospital and ask for the anesthetist or anesthesiologist on call. SEEK MEDICAL CARE IF:  You have nausea and vomiting that continue the day after anesthesia.  You develop a rash. SEEK IMMEDIATE MEDICAL CARE IF:   You have difficulty breathing.  You have chest pain.  You have any allergic problems.   This information is not intended to replace advice given to you by your health care provider. Make sure you discuss any questions you have with your health care provider.   Document Released: 03/30/2000 Document Revised: 01/12/2014 Document Reviewed: 04/22/2011 Elsevier Interactive Patient Education 2016 [employer] After Refer to this sheet in the next few weeks. These instructions provide you with information about caring for yourself after your  procedure. Your health care provider may also give you more specific instructions. Your treatment has been planned according to current medical practices, but problems sometimes occur. Call your health care provider if you have any problems or questions after your procedure. WHAT TO EXPECT AFTER THE PROCEDURE After your procedure, it is common for the pouch that holds your testicles (scrotum) to be painful, swollen, and bruised. HOME CARE INSTRUCTIONS Bathing  Ask your health care provider when you can shower, take baths, or go swimming.  If you were told to wear an athletic support strap, take it off when you shower or take a bath. Incision Care  Follow instructions from your health care provider about how to take care of your incision. Make sure you:  Wash your hands with soap and water before you change your bandage (dressing). If soap and water are not available, use hand sanitizer.  Change your dressing as told by your health care provider.  Leave stitches (sutures) in place.  Check your incision and scrotum every day for signs of infection. Check for:  More redness, swelling, or pain.  Blood or fluid.  Warmth.  Pus or a bad smell. Managing Pain, Stiffness, and Swelling  If directed, apply ice to the injured area:  Put ice in a plastic bag.  Place a towel between your skin and the bag.  Leave the ice on for 20 minutes, 2-3 times per day. Driving  Do not drive for 24 hours if you received a sedative.  Do not drive or operate heavy machinery while taking prescription pain medicine.  Ask your health care provider when it is safe to drive. Activity  Do not do any activities that require great strength and energy (are vigorous) for as long as told by your health care provider.  Return to your normal activities as told by your health care provider. Ask your health care provider what activities are safe for you.  Do not lift anything that is heavier than 10 lb (4.5 kg)  until your health care provider says that it is safe. General Instructions  Take over-the-counter and prescription medicines only as told by your health care provider.  Keep all follow-up visits as told by your health care provider. This is important.  If you were given an athletic support strap, wear it as told by your health care provider.  If you had a drain put in during the procedure, you will need to return to have it removed. SEEK MEDICAL CARE IF:  Your pain gets worse.  You have more redness, swelling, or pain around your scrotum.  You have blood or fluid coming from your scrotum.  Your incision feels warm to the touch.  You have pus or a bad smell coming from your scrotum.  You have a fever.   This information is not intended to replace advice given to you by your health care provider. Make sure you discuss any questions you have with your health care provider.   Document Released: 09/12/2014 Document Reviewed: 06/20/2014 Elsevier Interactive Patient Education [employer].

## 2015-10-18 NOTE — Anesthesia Procedure Notes (Signed)
Procedure Name: LMA Insertion Date/Time: 10/18/2015 12:53 PM Performed by: Lajuana Carry E Pre-anesthesia Checklist: Patient identified, Emergency Drugs available, Suction available and Patient being monitored Patient Re-evaluated:Patient Re-evaluated prior to inductionOxygen Delivery Method: Circle system utilized Preoxygenation: Pre-oxygenation with 100% oxygen Intubation Type: IV induction Ventilation: Mask ventilation without difficulty LMA: LMA inserted LMA Size: 4.0 Number of attempts: 2 Tube secured with: Tape Dental Injury: Teeth and Oropharynx as per pre-operative assessment

## 2016-01-01 ENCOUNTER — Other Ambulatory Visit: Payer: Self-pay | Admitting: Urology

## 2016-02-21 ENCOUNTER — Encounter (HOSPITAL_BASED_OUTPATIENT_CLINIC_OR_DEPARTMENT_OTHER): Payer: Self-pay | Admitting: *Deleted

## 2016-02-24 ENCOUNTER — Encounter (HOSPITAL_BASED_OUTPATIENT_CLINIC_OR_DEPARTMENT_OTHER): Payer: Self-pay | Admitting: *Deleted

## 2016-02-24 NOTE — Progress Notes (Signed)
NPO AFTER MN.  ARRIVE AT 0945.  NEEDS HG.  

## 2016-03-02 ENCOUNTER — Ambulatory Visit (HOSPITAL_BASED_OUTPATIENT_CLINIC_OR_DEPARTMENT_OTHER): Payer: Self-pay | Admitting: Anesthesiology

## 2016-03-02 ENCOUNTER — Ambulatory Visit (HOSPITAL_BASED_OUTPATIENT_CLINIC_OR_DEPARTMENT_OTHER)
Admission: RE | Admit: 2016-03-02 | Discharge: 2016-03-02 | Disposition: A | Discharge status: Home / Self Care | Payer: Self-pay | Source: Ambulatory Visit | Attending: Urology | Admitting: Urology

## 2016-03-02 ENCOUNTER — Encounter (HOSPITAL_BASED_OUTPATIENT_CLINIC_OR_DEPARTMENT_OTHER): Payer: Self-pay | Admitting: *Deleted

## 2016-03-02 ENCOUNTER — Encounter (HOSPITAL_BASED_OUTPATIENT_CLINIC_OR_DEPARTMENT_OTHER)
Admission: RE | Disposition: A | Discharge status: Home / Self Care | Payer: Self-pay | Source: Ambulatory Visit | Attending: Urology

## 2016-03-02 DIAGNOSIS — M7981 Nontraumatic hematoma of soft tissue: Secondary | ICD-10-CM | POA: Insufficient documentation

## 2016-03-02 DIAGNOSIS — F17210 Nicotine dependence, cigarettes, uncomplicated: Secondary | ICD-10-CM | POA: Insufficient documentation

## 2016-03-02 HISTORY — PX: HYDROCELE EXCISION: SHX482

## 2016-03-02 HISTORY — DX: Hydrocele, unspecified: N43.3

## 2016-03-02 LAB — POCT HEMOGLOBIN-HEMACUE: HEMOGLOBIN: 16 g/dL (ref 13.0–17.0)

## 2016-03-02 SURGERY — HYDROCELECTOMY
Anesthesia: General | Site: Scrotum | Laterality: Left

## 2016-03-02 MED ORDER — BUPIVACAINE HCL (PF) 0.25 % IJ SOLN
INTRAMUSCULAR | Status: DC | PRN
  Administered 2016-03-02: 7 mL

## 2016-03-02 MED ORDER — FENTANYL CITRATE (PF) 100 MCG/2ML IJ SOLN
INTRAMUSCULAR | Status: AC
Start: 2016-03-02 — End: 2016-03-02
  Filled 2016-03-02: qty 2

## 2016-03-02 MED ORDER — DOCUSATE SODIUM 100 MG PO CAPS: 100.0000 mg | ORAL_CAPSULE | Freq: Two times a day (BID) | ORAL | 1 refills | Status: DC

## 2016-03-02 MED ORDER — SENNA 8.6 MG PO TABS: 2.0000 | ORAL_TABLET | Freq: Every day | ORAL | 0 refills | Status: DC

## 2016-03-02 MED ORDER — PROMETHAZINE HCL 25 MG/ML IJ SOLN
6.2500 mg | INTRAMUSCULAR | Status: DC | PRN
  Filled 2016-03-02: qty 1

## 2016-03-02 MED ORDER — MEPERIDINE HCL 25 MG/ML IJ SOLN
6.2500 mg | INTRAMUSCULAR | Status: DC | PRN
  Filled 2016-03-02: qty 1

## 2016-03-02 MED ORDER — LIDOCAINE 2% (20 MG/ML) 5 ML SYRINGE
INTRAMUSCULAR | Status: DC | PRN
  Administered 2016-03-02: 100 mg via INTRAVENOUS

## 2016-03-02 MED ORDER — CEFAZOLIN SODIUM-DEXTROSE 2-4 GM/100ML-% IV SOLN
2.0000 g | INTRAVENOUS | Status: AC
  Administered 2016-03-02: 2 g via INTRAVENOUS
  Filled 2016-03-02: qty 100

## 2016-03-02 MED ORDER — FENTANYL CITRATE (PF) 100 MCG/2ML IJ SOLN
INTRAMUSCULAR | Status: DC | PRN
  Administered 2016-03-02 (×4): 50 ug via INTRAVENOUS

## 2016-03-02 MED ORDER — DEXAMETHASONE SODIUM PHOSPHATE 10 MG/ML IJ SOLN
INTRAMUSCULAR | Status: AC
  Filled 2016-03-02: qty 1

## 2016-03-02 MED ORDER — DOCUSATE SODIUM 100 MG PO CAPS: 100.0000 mg | ORAL_CAPSULE | Freq: Two times a day (BID) | ORAL | 1 refills | Status: AC

## 2016-03-02 MED ORDER — HYDROMORPHONE HCL 1 MG/ML IJ SOLN
0.2500 mg | INTRAMUSCULAR | Status: DC | PRN
  Administered 2016-03-02: 0.25 mg via INTRAVENOUS
  Filled 2016-03-02: qty 0.5

## 2016-03-02 MED ORDER — MIDAZOLAM HCL 2 MG/2ML IJ SOLN
INTRAMUSCULAR | Status: AC
  Filled 2016-03-02: qty 2

## 2016-03-02 MED ORDER — PROPOFOL 10 MG/ML IV BOLUS
INTRAVENOUS | Status: DC | PRN
  Administered 2016-03-02: 50 mg via INTRAVENOUS
  Administered 2016-03-02: 175 mg via INTRAVENOUS

## 2016-03-02 MED ORDER — OXYCODONE HCL 5 MG PO TABS: 5.0000 mg | ORAL_TABLET | ORAL | 0 refills | Status: DC | PRN

## 2016-03-02 MED ORDER — DEXAMETHASONE SODIUM PHOSPHATE 4 MG/ML IJ SOLN
INTRAMUSCULAR | Status: DC | PRN
  Administered 2016-03-02: 10 mg via INTRAVENOUS

## 2016-03-02 MED ORDER — CEFAZOLIN SODIUM-DEXTROSE 2-4 GM/100ML-% IV SOLN
INTRAVENOUS | Status: AC
Start: 2016-03-02 — End: 2016-03-02
  Filled 2016-03-02: qty 100

## 2016-03-02 MED ORDER — HYDROMORPHONE HCL 2 MG/ML IJ SOLN
INTRAMUSCULAR | Status: AC
  Filled 2016-03-02: qty 1

## 2016-03-02 MED ORDER — SENNA 8.6 MG PO TABS: 2.0000 | ORAL_TABLET | Freq: Every day | ORAL | 0 refills | Status: AC

## 2016-03-02 MED ORDER — OXYCODONE HCL 5 MG PO TABS
5.0000 mg | ORAL_TABLET | Freq: Once | ORAL | Status: DC | PRN
  Filled 2016-03-02: qty 1

## 2016-03-02 MED ORDER — ONDANSETRON HCL 4 MG/2ML IJ SOLN
INTRAMUSCULAR | Status: DC | PRN
  Administered 2016-03-02: 4 mg via INTRAVENOUS

## 2016-03-02 MED ORDER — CEFAZOLIN IN D5W 1 GM/50ML IV SOLN
1.0000 g | INTRAVENOUS | Status: DC
  Filled 2016-03-02: qty 50

## 2016-03-02 MED ORDER — PROPOFOL 10 MG/ML IV BOLUS
INTRAVENOUS | Status: AC
Start: 2016-03-02 — End: 2016-03-02
  Filled 2016-03-02: qty 40

## 2016-03-02 MED ORDER — ONDANSETRON HCL 4 MG/2ML IJ SOLN
INTRAMUSCULAR | Status: AC
  Filled 2016-03-02: qty 2

## 2016-03-02 MED ORDER — MIDAZOLAM HCL 5 MG/5ML IJ SOLN
INTRAMUSCULAR | Status: DC | PRN
  Administered 2016-03-02: 2 mg via INTRAVENOUS

## 2016-03-02 MED ORDER — LACTATED RINGERS IV SOLN
INTRAVENOUS | Status: DC
  Administered 2016-03-02 (×2): via INTRAVENOUS
  Filled 2016-03-02: qty 1000

## 2016-03-02 MED ORDER — OXYCODONE HCL 5 MG/5ML PO SOLN
5.0000 mg | Freq: Once | ORAL | Status: DC | PRN
  Filled 2016-03-02: qty 5

## 2016-03-02 MED ORDER — FENTANYL CITRATE (PF) 100 MCG/2ML IJ SOLN
INTRAMUSCULAR | Status: AC
  Filled 2016-03-02: qty 2

## 2016-03-02 SURGICAL SUPPLY — 35 items
ADH SKN CLS APL DERMABOND .7 (GAUZE/BANDAGES/DRESSINGS) ×1
BLADE CLIPPER SENSICLIP SURGIC (BLADE) ×2 IMPLANT
BLADE SURG 15 STRL LF DISP TIS (BLADE) ×1 IMPLANT
BLADE SURG 15 STRL SS (BLADE) ×3
BNDG GAUZE ELAST 4 BULKY (GAUZE/BANDAGES/DRESSINGS) ×2 IMPLANT
COVER BACK TABLE 60X90IN (DRAPES) ×3 IMPLANT
COVER MAYO STAND STRL (DRAPES) ×3 IMPLANT
DERMABOND ADVANCED (GAUZE/BANDAGES/DRESSINGS) ×2
DERMABOND ADVANCED .7 DNX12 (GAUZE/BANDAGES/DRESSINGS) ×1 IMPLANT
DRAIN PENROSE 18X1/2 LTX STRL (DRAIN) ×3 IMPLANT
DRAPE LAPAROTOMY 100X72 PEDS (DRAPES) ×3 IMPLANT
ELECT REM PT RETURN 9FT ADLT (ELECTROSURGICAL) ×3
ELECTRODE REM PT RTRN 9FT ADLT (ELECTROSURGICAL) ×1 IMPLANT
GLOVE BIO SURGEON STRL SZ8 (GLOVE) ×9 IMPLANT
GOWN STRL REUS W/TWL LRG LVL3 (GOWN DISPOSABLE) ×2 IMPLANT
GOWN STRL REUS W/TWL XL LVL3 (GOWN DISPOSABLE) ×4 IMPLANT
KIT RM TURNOVER CYSTO AR (KITS) ×3 IMPLANT
NDL HYPO 25X1 1.5 SAFETY (NEEDLE) ×1 IMPLANT
NEEDLE HYPO 25X1 1.5 SAFETY (NEEDLE) ×3 IMPLANT
NS IRRIG 500ML POUR BTL (IV SOLUTION) ×2 IMPLANT
PACK BASIN DAY SURGERY FS (CUSTOM PROCEDURE TRAY) ×3 IMPLANT
PENCIL BUTTON HOLSTER BLD 10FT (ELECTRODE) ×3 IMPLANT
SUPPORT SCROTAL LG STRP (MISCELLANEOUS) ×2 IMPLANT
SUPPORTER ATHLETIC LG (MISCELLANEOUS) ×1
SUT ETHILON 4 0 PS 2 18 (SUTURE) ×3 IMPLANT
SUT MNCRL AB 4-0 PS2 18 (SUTURE) ×3 IMPLANT
SUT VIC AB 2-0 SH 27 (SUTURE) ×6
SUT VIC AB 2-0 SH 27XBRD (SUTURE) ×2 IMPLANT
SUT VICRYL 4-0 PS2 18IN ABS (SUTURE) ×2 IMPLANT
SYR CONTROL 10ML LL (SYRINGE) ×3 IMPLANT
TOWEL OR 17X24 6PK STRL BLUE (TOWEL DISPOSABLE) ×6 IMPLANT
TRAY DSU PREP LF (CUSTOM PROCEDURE TRAY) ×3 IMPLANT
TUBE CONNECTING 12'X1/4 (SUCTIONS) ×1
TUBE CONNECTING 12X1/4 (SUCTIONS) ×2 IMPLANT
YANKAUER SUCT BULB TIP NO VENT (SUCTIONS) ×3 IMPLANT

## 2016-03-02 NOTE — Anesthesia Postprocedure Evaluation (Signed)
Anesthesia Post Note  Patient: Justin Newman  Procedure(s) Performed: Procedure(s) (LRB): HYDROCELECTOMY ADULT (Left)  Patient location during evaluation: PACU Anesthesia Type: General Level of consciousness: awake Pain management: pain level controlled Respiratory status: spontaneous breathing Cardiovascular status: stable Anesthetic complications: no       Last Vitals:  Vitals:   03/02/16 1345 03/02/16 1400  BP: (!) 131/93 119/78  Pulse: 68 67  Resp: 12 (!) 8  Temp:      Last Pain:  Vitals:   03/02/16 1409  TempSrc:   PainSc: 4                  Annalyn Blecher

## 2016-03-02 NOTE — Op Note (Signed)
Left scrot exp, re-hydrocele 350 brown old fluid  Preoperative Diagnosis: Left hydrocele vs hematoma  Postoperative Diagnosis:  Left complex hydrocele  Procedure(s) Performed:  1. Scrotal exploration, excision of complex hydrocele   Teaching Surgeon:  Nicolette Bang, MD  Resident Surgeon:  Sharmaine Base, MD  Assistant(s):  None  Anesthesia:  General via LMA  Fluids:  See anesthesia record  Estimated blood loss:  10 mL  Specimens: Left sided scrotal fluid for cytology  Cultures:  None  Drains:  1/2 penrose drain  Complications:  None  Indications: This is a 41yo male patient with history of left hydrocele s/p hydrocelectomy 10/2015 with Dr. Alyson Ingles. He had recurrent left sided scrotal swelling with recent U/S findings suggestive of hydrocele vs hematoma. After discussion of the risks & benefits and alternatives to surgical approach, the patient wishes to proceed with left scrotal exploration with hydrocelectomy and possible orchiectomy. Please refer to clinic notes for further information.  Findings:  Thick rind surrounding left spermatic cord. ~350cc dark brown fluid aspirated on entry into rind. Edematous and fibrotic left epididymis with unhealthy appearing testicle however viable seminiferous tubules on left. 1/2 penrose placed in left inferior scrotum.   Description:  The patient was correctly identified in the preop holding area where written informed consent as well potential risk and complication reviewed. The patient agreed and was brought back to the operative suite where a preinduction timeout was performed. Once correct information was verified, general anesthesia was induced via LMA. The patient was then gently placed into supine position with SCDs in place for VTE prophylaxis. They were prepped and draped in the usual sterile fashion and given appropriate preoperative antibiotics with Ancef. A second timeout was then performed.   A 4-5cm left transverse scrotal  incision was made. Using a combination of blunt dissection and Bovie electrocautery we dissected through the dartos layer until a rind overlying the spermatic cord was encountered. We carefully entered this ensuring not to damage any cord structures. The rind was opened sharply away from the testicle and cord with drainage of approximately 350cc dark brown fluid. Some of this fluid was sent for cytology. The fibrotic hydrocele sac was then incised and the left testicle was delivered. The testicle appeared mottled and small. The surrounding left epididymis was edematous with surrounding fibrosis. Overall the patient appeared again to have suffered a prior infectious incident with lasting left sided testicular and epididymal deficits. The edges of the hydrocele sac were oversewn with 3-0 vicryl. The remainder of the surgical field was thoroughly inspected and meticulous hemostasis was attained. The left testicle was replaced into the scrotum. The lateral sulcus of the testicle was essentially not able to be found, but we replaced the testicle in a neutral lie with no twisting of the cord noted. A 1/2 penrose drain was placed in the left inferior scrotum, no stitch was placed. The dartos layer was closed with running 3-0 Vicryl. The scrotal skin was closed with running 4-0 Monocryl in horizontal mattress fashion. Dermabond was applied. The scrotum was dressed with fluff gauze and scrotal support. The patient was woken up from the procedure and taken to the recovery unit for routine postoperative care. All surgical counts were correct x2.    Post Op Plan:   1. Discharge patient home when meets PACU criteria. 2. Drain will either fall out on its own or can be removed by patient on POD3 3. D/c with oxycodone, 7d bactrim, colace 4. Follow up 4 weeks  Attestation:  Dr.  Iysha Mishkin was present and scrubbed for the entire procedure.  Sharmaine Base, MD Resident, Department of Urology

## 2016-03-02 NOTE — Discharge Instructions (Signed)
Hydrocelectomy, Adult, Care After This sheet gives you information about how to care for yourself after your procedure. Your health care provider may also give you more specific instructions. If you have problems or questions, contact your health care provider. What can I expect after the procedure? After your procedure, it is common to have mild discomfort, swelling, and bruising in the pouch that holds your testicles (scrotum). Follow these instructions at home: Bathing  Ask your health care provider when you can shower, take baths, or go swimming.  If you were told to wear an athletic support strap, take it off when you shower or take a bath. Incision care  Follow instructions from your health care provider about how to take care of your incision. Make sure you:  Wash your hands with soap and water before you change your bandage (dressing). If soap and water are not available, use hand sanitizer.  Change your dressing as told by your health care provider.  Leave stitches (sutures) in place.  Check your incision and scrotum every day for signs of infection. Check for:  More redness, swelling, or pain.  Blood or fluid.  Warmth.  Pus or a bad smell. Managing pain, stiffness, and swelling  If directed, apply ice to the injured area:  Put ice in a plastic bag.  Place a towel between your skin and the bag.  Leave the ice on for 20 minutes, 2-3 times per day. Driving  Do not drive for 24 hours if you were given a sedative.  Do not drive or use heavy machinery while taking prescription pain medicine.  Ask your health care provider when it is safe to drive. Activity  Do not do any activities that require great strength and energy (are vigorous) for as long as told by your health care provider.  Return to your normal activities as told by your health care provider. Ask your health care provider what activities are safe for you.  Do not lift anything that is heavier  than 10 lb (4.5 kg) until your health care provider says that it is safe. General instructions  Take over-the-counter and prescription medicines only as told by your health care provider.  Keep all follow-up visits as told by your health care provider. This is important.  If you were given an athletic support strap, wear it as told by your health care provider.  If you had a drain put in during the procedure, you will need to return for a follow-up visit to have it removed. Contact a health care provider if:  Your pain is not controlled with medicine.  You have more redness or swelling around your scrotum.  You have blood or fluid coming from your scrotum.  Your incision feels warm to the touch.  You have pus or a bad smell coming from your scrotum.  You have a fever. This information is not intended to replace advice given to you by your health care provider. Make sure you discuss any questions you have with your health care provider. Document Released: 09/12/2014 Document Revised: 09/21/2015 Document Reviewed: 09/21/2015 Elsevier Interactive Patient Education  2017 [place] Anesthesia Home Care Instructions  Activity: Get plenty of rest for the remainder of the day. A responsible adult should stay with you for 24 hours following the procedure.  For the next 24 hours, DO NOT: -Drive a car -Paediatric nurse -Drink alcoholic beverages -Take any medication unless instructed by your physician -Make any legal decisions or sign  important papers.  Meals: Start with liquid foods such as gelatin or soup. Progress to regular foods as tolerated. Avoid greasy, spicy, heavy foods. If nausea and/or vomiting occur, drink only clear liquids until the nausea and/or vomiting subsides. Call your physician if vomiting continues.  Special Instructions/Symptoms: Your throat may feel dry or sore from the anesthesia or the breathing tube placed in your throat during surgery. If this  causes discomfort, gargle with warm salt water. The discomfort should disappear within 24 hours.  If you had a scopolamine patch placed behind your ear for the management of post- operative nausea and/or vomiting:  1. The medication in the patch is effective for 72 hours, after which it should be removed.  Wrap patch in a tissue and discard in the trash. Wash hands thoroughly with soap and water. 2. You may remove the patch earlier than 72 hours if you experience unpleasant side effects which may include dry mouth, dizziness or visual disturbances. 3. Avoid touching the patch. Wash your hands with soap and water after contact with the patch.    Post Anesthesia Home Care Instructions  Activity: Get plenty of rest for the remainder of the day. A responsible adult should stay with you for 24 hours following the procedure.  For the next 24 hours, DO NOT: -Drive a car -Paediatric nurse -Drink alcoholic beverages -Take any medication unless instructed by your physician -Make any legal decisions or sign important papers.  Meals: Start with liquid foods such as gelatin or soup. Progress to regular foods as tolerated. Avoid greasy, spicy, heavy foods. If nausea and/or vomiting occur, drink only clear liquids until the nausea and/or vomiting subsides. Call your physician if vomiting continues.  Special Instructions/Symptoms: Your throat may feel dry or sore from the anesthesia or the breathing tube placed in your throat during surgery. If this causes discomfort, gargle with warm salt water. The discomfort should disappear within 24 hours.  If you had a scopolamine patch placed behind your ear for the management of post- operative nausea and/or vomiting:  1. The medication in the patch is effective for 72 hours, after which it should be removed.  Wrap patch in a tissue and discard in the trash. Wash hands thoroughly with soap and water. 2. You may remove the patch earlier than 72 hours if you  experience unpleasant side effects which may include dry mouth, dizziness or visual disturbances. 3. Avoid touching the patch. Wash your hands with soap and water after contact with the patch.   Bactrim DS 800 -160 mg tablet. Take one tablet by mouth two times a day.

## 2016-03-02 NOTE — Transfer of Care (Signed)
Immediate Anesthesia Transfer of Care Note  Patient: Justin Newman  Procedure(s) Performed: Procedure(s): HYDROCELECTOMY ADULT (Left)  Patient Location: PACU  Anesthesia Type:General  Level of Consciousness: awake, alert , oriented and patient cooperative  Airway & Oxygen Therapy: Patient Spontanous Breathing and Patient connected to nasal cannula oxygen  Post-op Assessment: Report given to RN and Post -op Vital signs reviewed and stable  Post vital signs: Reviewed and stable  Last Vitals:  Vitals:   03/02/16 1003  BP: 128/84  Pulse: 64  Resp: 16  Temp: 36.8 C    Last Pain:  Vitals:   03/02/16 1003  TempSrc: Oral      Patients Stated Pain Goal: 7 ([id number])  Complications: No apparent anesthesia complications

## 2016-03-02 NOTE — Anesthesia Postprocedure Evaluation (Signed)
Anesthesia Post Note  Patient: Justin Newman  Procedure(s) Performed: Procedure(s) (LRB): HYDROCELECTOMY ADULT (Left)  Patient location during evaluation: SICU Anesthesia Type: General Level of consciousness: awake Pain management: pain level controlled Respiratory status: spontaneous breathing Cardiovascular status: stable Anesthetic complications: no       Last Vitals:  Vitals:   03/02/16 1345 03/02/16 1400  BP: (!) 131/93 119/78  Pulse: 68 67  Resp: 12 (!) 8  Temp:      Last Pain:  Vitals:   03/02/16 1409  TempSrc:   PainSc: 4                  Erikka Follmer

## 2016-03-02 NOTE — H&P (Signed)
This will serve as preoperative H&P. Please see clinic note below from 12/26/15 -    Office Visit Report     12/26/2015   --------------------------------------------------------------------------------   Justin Newman  MRN: [id number]  PRIMARY CARE:    DOB: 03/29/1975, 41 year old Male  REFERRING:    PROVIDER:  Nicolette Bang, M.D.    SUPERVISING:  Irine Seal, M.D.    TREATING:  Lorayne Bender    LOCATION:  [facility] [phone]   --------------------------------------------------------------------------------   CC: I have swelling in my scrotum.  HPI: Justin Newman is a 41 year-old male established patient who is here for scrotal swelling.    41 year old male with history of hydrocele returns today in follow-up. He had a hydrocelectomy performed 10/18/15 with Dr. Alyson Ingles. Pathology benign. He reported he had been doing well and not requiring pain medications. However, he did have some residual left scrotal swelling, improved from pre-op. He reports this is a bit worse from previous and causes discomfort with movement. He has had no fevers/chills, UTI, recent illness. 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      ALLERGIES: None   MEDICATIONS: None   GU PSH: Excision of Hydrocele, Left - 10/15/2015    NON-GU PSH: Inguinal Hernia Repair > 5 yrs - 1997    GU PMH: Encysted hydrocele - 11/14/2015, - 10/03/2015 Other specified disorders of the male genital organs - 11/14/2015    NON-GU PMH: None   FAMILY HISTORY: 3 daughters - Daughter father still living 93 - Father mother deceased at age 21 from pneumonia - Mother   SOCIAL HISTORY: Marital Status: Unknown Current Smoking Status: Patient smokes. Has smoked since 09/06/1990. Smokes 1 pack per day.  Has never drank.  Drinks 4+ caffeinated drinks per day. Patient's occupation is/was Unemployed.    REVIEW OF SYSTEMS:    GU Review Male:   Patient reports erection problems. Patient  denies frequent urination, hard to postpone urination, burning/ pain with urination, get up at night to urinate, leakage of urine, stream starts and stops, trouble starting your stream, have to strain to urinate , and penile pain.  Gastrointestinal (Upper):   Patient denies nausea, vomiting, and indigestion/ heartburn.  Gastrointestinal (Lower):   Patient denies diarrhea and constipation.  Constitutional:   Patient denies weight loss, fever, fatigue, and night sweats.  Skin:   Patient denies skin rash/ lesion and itching.  Eyes:   Patient denies blurred vision and double vision.  Ears/ Nose/ Throat:   Patient denies sore throat and sinus problems.  Hematologic/Lymphatic:   Patient denies swollen glands and easy bruising.  Cardiovascular:   Patient denies leg swelling and chest pains.  Respiratory:   Patient denies cough and shortness of breath.  Endocrine:   Patient denies excessive thirst.  Musculoskeletal:   Patient denies back pain and joint pain.  Neurological:   Patient denies headaches and dizziness.  Psychologic:   Patient denies depression and anxiety.   VITAL SIGNS:      12/26/2015 02:24 PM  Weight 175 lb / 79.38 kg  Height 71 in / 180.34 cm  BP 135/78 mmHg  Pulse 65 /min  BMI 24.4 kg/m   GU PHYSICAL EXAMINATION:    Testes: Normal right testis. Large left residual hydrocele. Testicle palpable and is smaller than right   MULTI-SYSTEM PHYSICAL EXAMINATION:    Constitutional: Well-nourished. No physical deformities. Normally developed. Good grooming.  Respiratory: No labored breathing, no use of accessory muscles.   Cardiovascular: Normal  temperature, normal extremity pulses, no swelling, no varicosities.  Skin: No paleness, no jaundice, no cyanosis. No lesion, no ulcer, no rash.  Neurologic / Psychiatric: Oriented to time, oriented to place, oriented to person. No depression, no anxiety, no agitation.  Gastrointestinal: No mass, no tenderness, no rigidity, non obese abdomen.   Eyes: Normal conjunctivae. Normal eyelids.  Ears, Nose, Mouth, and Throat: Left ear no scars, no lesions, no masses. Right ear no scars, no lesions, no masses. Nose no scars, no lesions, no masses. Normal hearing. Normal lips.  Musculoskeletal: Normal gait and station of head and neck.     PAST DATA REVIEWED:  Source Of History:  Patient  Records Review:   Previous Patient Records   PROCEDURES:         Scrotal Ultrasound [id number]  Right Testicle: Length: 4.66 cm  Height: 2.30 cm  Width: 3.39 cm  Left Testicle: Length: 2.74 cm  Height: 2.96 cm  Width: 5.26cm  Left Testis/Epididymis:  Unable to visualized Left Epididymis  Right Testis/Epididymis:  1)Right Epi Head Cystic Area measuring 0.45cm x 0.29cm x 0.45cm-----2)Right Epi Head Cystic Area measuring 0.20cm      Bilateral Blood Flow Visualized, subjectively R > L No Varicocele visualized today  Complex Appearing Area on Left measuring estimated 7.53cm x 5.46cm x 8.01cm. Consistent with possible hematocele.   ASSESSMENT:      ICD-10 Details  1 GU:   Encysted hydrocele - N43.0    PLAN:           Schedule Return Notes: Return 2-4 weeks post-op          Document Letter(s):  Created for Patient: Clinical Summary         Notes:   Ultrasound today consistent with recurrent hydrocele versus hematocele. Subjectively less flow to left testicle than right consistent with prior findings intraoperatively.   Discussed options for management, including observation, aspiration, redo hydrocelectomy, orchiectomy. He is adamant about having something done. He would prefer hydrocelectomy. We discussed the possibility of recurrence. Given the ultrasound findings, he is amenable to orchiectomy.   We will schedule him in the near future for left redo hydrocelectomy possible orchiectomy by Dr. Alyson Ingles.   Visit supervised by Dr. Jeffie Pollock. Also discussed with Dr. Alyson Ingles.

## 2016-03-02 NOTE — Anesthesia Procedure Notes (Signed)
Procedure Name: LMA Insertion Date/Time: 03/02/2016 11:49 AM Performed by: Wanita Chamberlain Pre-anesthesia Checklist: Patient identified, Timeout performed, Emergency Drugs available, Suction available and Patient being monitored Patient Re-evaluated:Patient Re-evaluated prior to inductionOxygen Delivery Method: Circle system utilized Preoxygenation: Pre-oxygenation with 100% oxygen Intubation Type: IV induction Ventilation: Mask ventilation without difficulty LMA: LMA inserted LMA Size: 4.0 Number of attempts: 1 Placement Confirmation: positive ETCO2 and breath sounds checked- equal and bilateral Tube secured with: Tape Dental Injury: Teeth and Oropharynx as per pre-operative assessment

## 2016-03-02 NOTE — Anesthesia Preprocedure Evaluation (Addendum)
Anesthesia Evaluation  Patient identified by MRN, date of birth, ID band Patient awake    Reviewed: Allergy & Precautions, NPO status , Patient's Chart, lab work & pertinent test results  Airway Mallampati: II  TM Distance: >3 FB Neck ROM: Full    Dental  (+) Teeth Intact, Dental Advisory Given, Chipped,    Pulmonary Current Smoker,    breath sounds clear to auscultation       Cardiovascular negative cardio ROS   Rhythm:Regular Rate:Normal     Neuro/Psych    GI/Hepatic Neg liver ROS, GERD  ,  Endo/Other  negative endocrine ROS  Renal/GU      Musculoskeletal   Abdominal   Peds  Hematology   Anesthesia Other Findings   Reproductive/Obstetrics                            Anesthesia Physical  Anesthesia Plan  ASA: II  Anesthesia Plan: General   Post-op Pain Management:    Induction: Intravenous  Airway Management Planned: LMA  Additional Equipment:   Intra-op Plan:   Post-operative Plan: Extubation in OR  Informed Consent: I have reviewed the patients History and Physical, chart, labs and discussed the procedure including the risks, benefits and alternatives for the proposed anesthesia with the patient or authorized representative who has indicated his/her understanding and acceptance.   Dental advisory given  Plan Discussed with: Anesthesiologist and CRNA  Anesthesia Plan Comments:         Anesthesia Quick Evaluation

## 2016-03-03 ENCOUNTER — Encounter (HOSPITAL_BASED_OUTPATIENT_CLINIC_OR_DEPARTMENT_OTHER): Payer: Self-pay | Admitting: Urology

## 2017-02-09 ENCOUNTER — Emergency Department (HOSPITAL_COMMUNITY)
Admission: EM | Admit: 2017-02-09 | Discharge: 2017-02-09 | Disposition: A | Discharge status: Home / Self Care | Payer: Self-pay | Attending: Emergency Medicine | Admitting: Emergency Medicine

## 2017-02-09 ENCOUNTER — Encounter (HOSPITAL_COMMUNITY): Payer: Self-pay | Admitting: Emergency Medicine

## 2017-02-09 DIAGNOSIS — R509 Fever, unspecified: Secondary | ICD-10-CM | POA: Insufficient documentation

## 2017-02-09 DIAGNOSIS — Z5321 Procedure and treatment not carried out due to patient leaving prior to being seen by health care provider: Secondary | ICD-10-CM | POA: Insufficient documentation

## 2017-02-09 DIAGNOSIS — R1110 Vomiting, unspecified: Secondary | ICD-10-CM | POA: Insufficient documentation

## 2017-02-09 LAB — URINALYSIS, ROUTINE W REFLEX MICROSCOPIC
Bilirubin Urine: NEGATIVE
Glucose, UA: NEGATIVE mg/dL
Hgb urine dipstick: NEGATIVE
Ketones, ur: 20 mg/dL — AB
Leukocytes, UA: NEGATIVE
Nitrite: NEGATIVE
Protein, ur: 30 mg/dL — AB
SPECIFIC GRAVITY, URINE: 1.033 — AB (ref 1.005–1.030)
pH: 5 (ref 5.0–8.0)

## 2017-02-09 LAB — CBC
HEMATOCRIT: 47.3 % (ref 39.0–52.0)
HEMOGLOBIN: 16.4 g/dL (ref 13.0–17.0)
MCH: 31.7 pg (ref 26.0–34.0)
MCHC: 34.7 g/dL (ref 30.0–36.0)
MCV: 91.5 fL (ref 78.0–100.0)
Platelets: 285 10*3/uL (ref 150–400)
RBC: 5.17 MIL/uL (ref 4.22–5.81)
RDW: 13.3 % (ref 11.5–15.5)
WBC: 7.4 10*3/uL (ref 4.0–10.5)

## 2017-02-09 LAB — COMPREHENSIVE METABOLIC PANEL
ALBUMIN: 4.1 g/dL (ref 3.5–5.0)
ALT: 54 U/L (ref 17–63)
AST: 43 U/L — AB (ref 15–41)
Alkaline Phosphatase: 75 U/L (ref 38–126)
Anion gap: 11 (ref 5–15)
BILIRUBIN TOTAL: 0.5 mg/dL (ref 0.3–1.2)
BUN: 20 mg/dL (ref 6–20)
CO2: 30 mmol/L (ref 22–32)
Calcium: 8.9 mg/dL (ref 8.9–10.3)
Chloride: 96 mmol/L — ABNORMAL LOW (ref 101–111)
Creatinine, Ser: 1.09 mg/dL (ref 0.61–1.24)
GFR calc Af Amer: >60 mL/min (ref >60)
GFR calc non Af Amer: >60 mL/min (ref >60)
GLUCOSE: 121 mg/dL — AB (ref 65–99)
POTASSIUM: 3.7 mmol/L (ref 3.5–5.1)
SODIUM: 137 mmol/L (ref 135–145)
TOTAL PROTEIN: 7.9 g/dL (ref 6.5–8.1)

## 2017-02-09 LAB — LIPASE, BLOOD: Lipase: 20 U/L (ref 11–51)

## 2017-02-09 LAB — RAPID STREP SCREEN (MED CTR MEBANE ONLY): STREPTOCOCCUS, GROUP A SCREEN (DIRECT): NEGATIVE

## 2017-02-09 MED ORDER — ACETAMINOPHEN 325 MG PO TABS
650.0000 mg | ORAL_TABLET | Freq: Once | ORAL | Status: AC | PRN
  Administered 2017-02-09: 650 mg via ORAL
  Filled 2017-02-09: qty 2

## 2017-02-09 NOTE — ED Notes (Signed)
Patient informed staff that he felt much better and would like to leave.

## 2017-02-09 NOTE — ED Triage Notes (Signed)
Per pt, states he has been sick since Saturday-states fever, cough, body aches, abdominal pain, vomiting and sore throat-no relief with OTC meds-states no pain now-unable to keep POs down

## 2017-02-11 LAB — CULTURE, GROUP A STREP (THRC)

## 2018-03-14 IMAGING — US US ART/VEN ABD/PELV/SCROTUM DOPPLER LTD
1 series · 13 of 25 positions shown · non-contrast
Comparison: None.

CLINICAL DATA: Scrotal swelling for 4 days.

EXAM:
SCROTAL ULTRASOUND
DOPPLER ULTRASOUND OF THE TESTICLES
TECHNIQUE: Complete ultrasound examination of the testicles, epididymis, and
other scrotal structures was performed. Color and spectral Doppler
ultrasound were also utilized to evaluate blood flow to the
testicles.

[Series 1: us art/ven abd/pelv/scrotum doppler ltd · 0.06mm/px · 87 acquisitions, 13 frames shown]
[im 1/87]
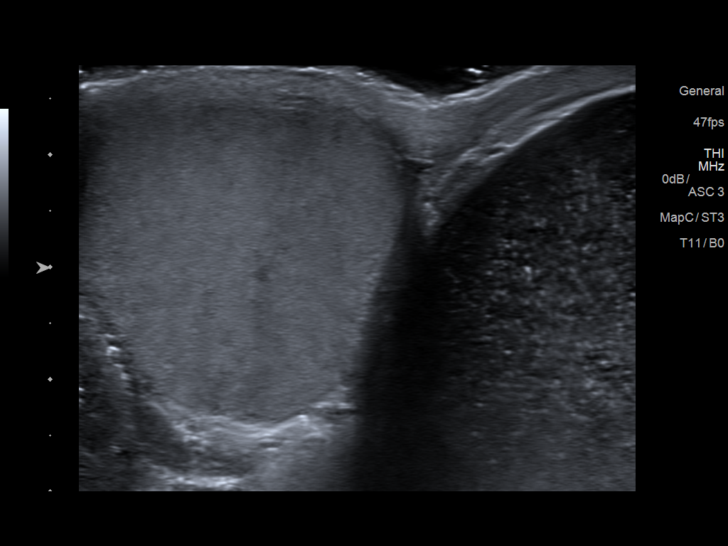
[im 8/87]
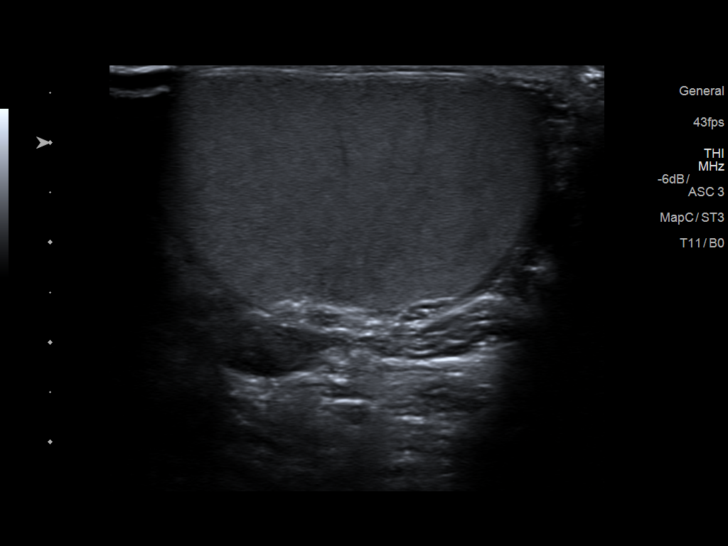
[im 15/87]
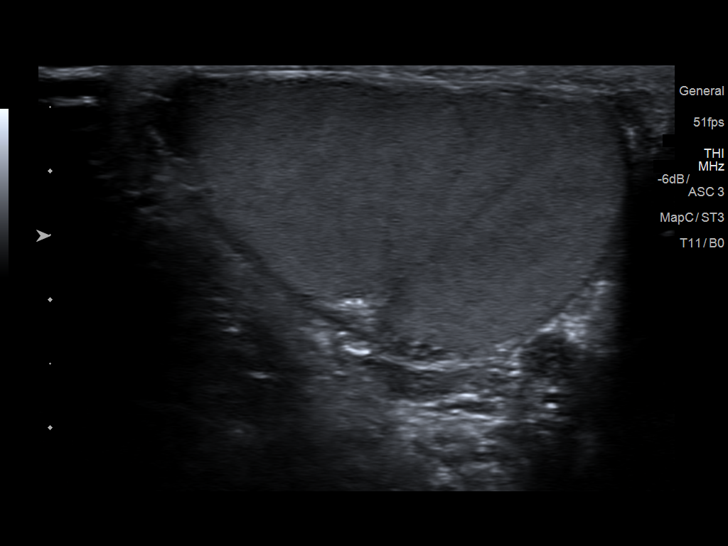
[im 22/87]
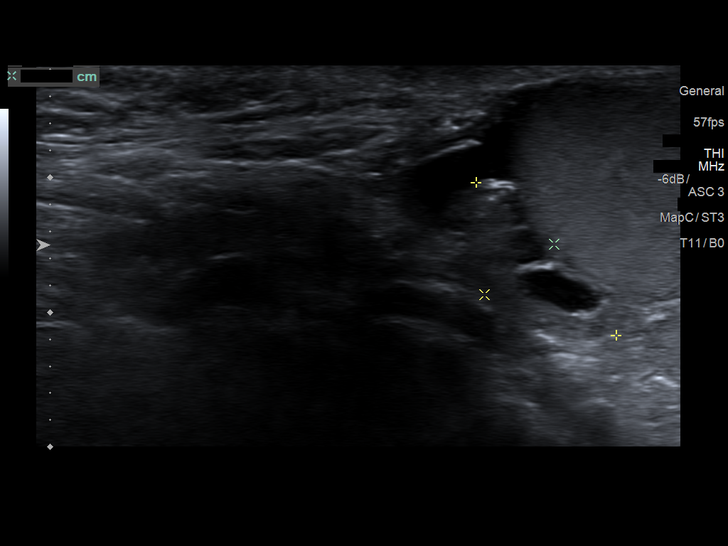
[im 29/87]
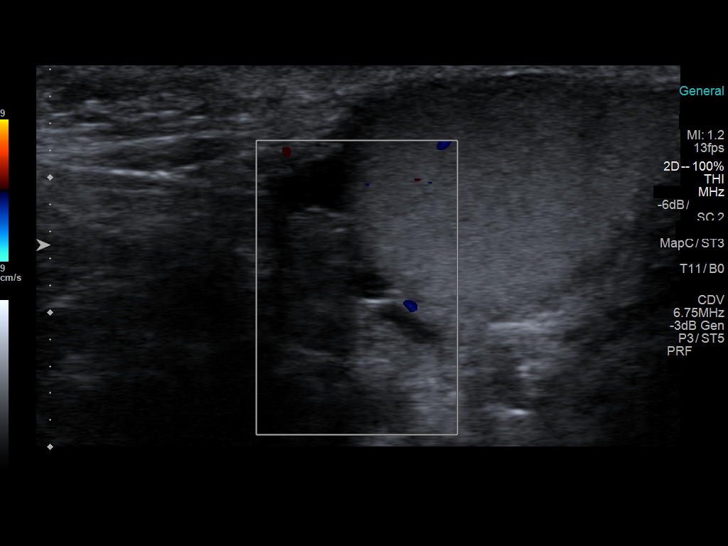
[im 36/87]
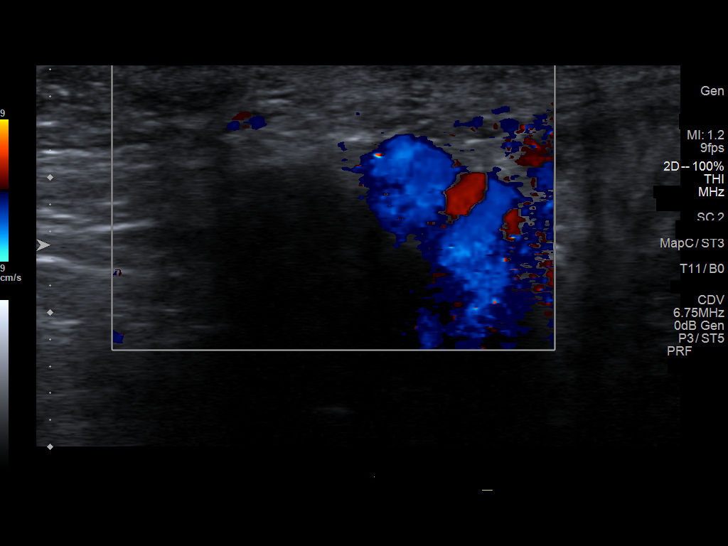
[im 44/87]
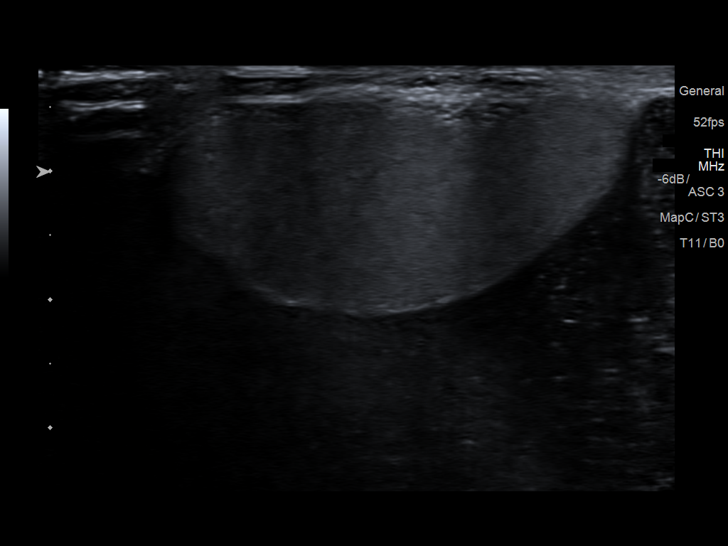
[im 51/87]
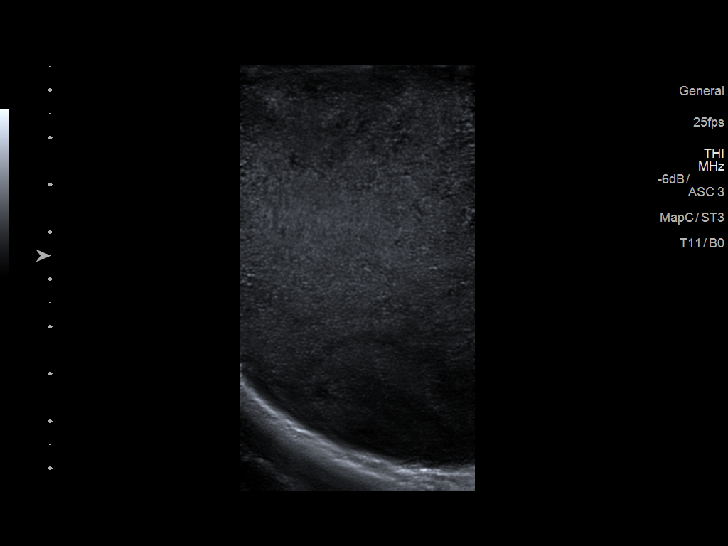
[im 58/87]
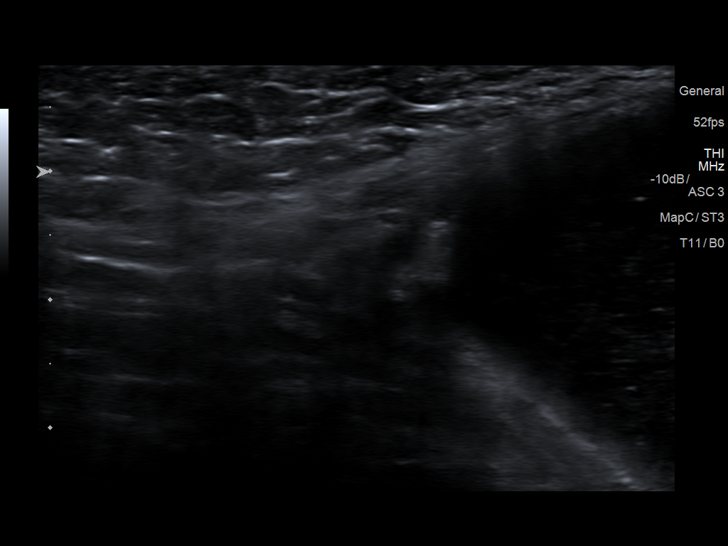
[im 65/87]
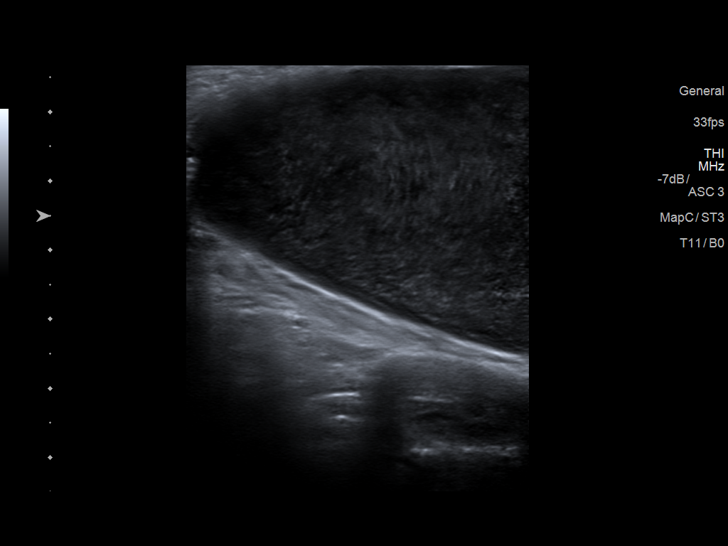
[im 72/87]
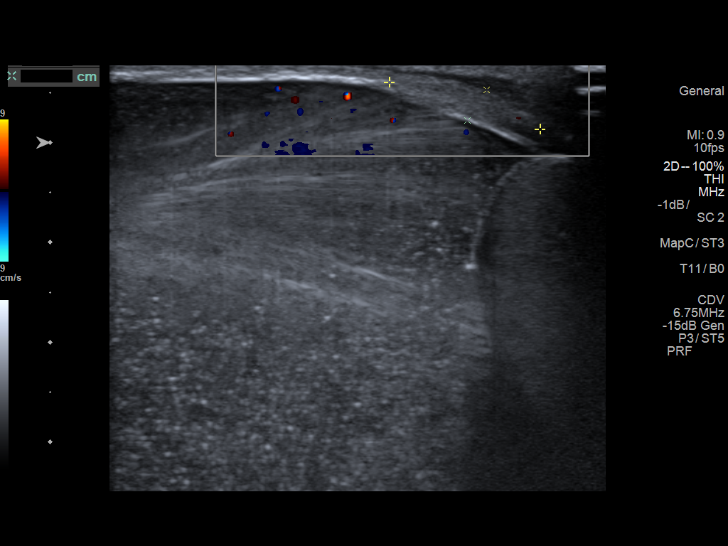
[im 79/87]
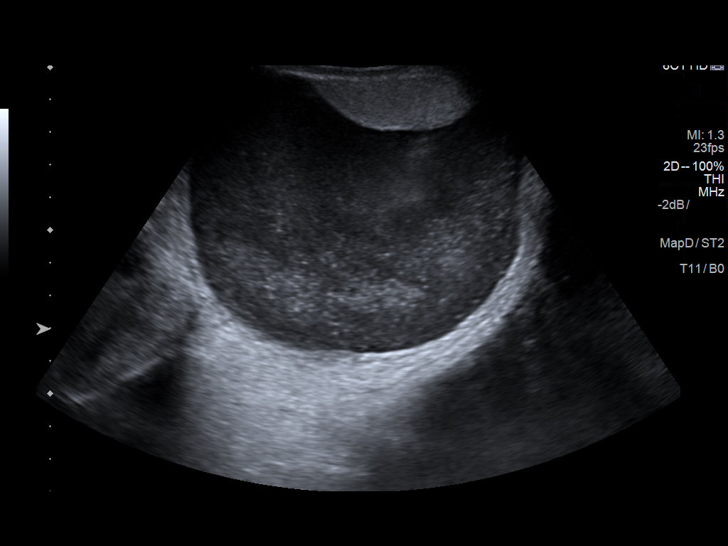
[im 87/87]
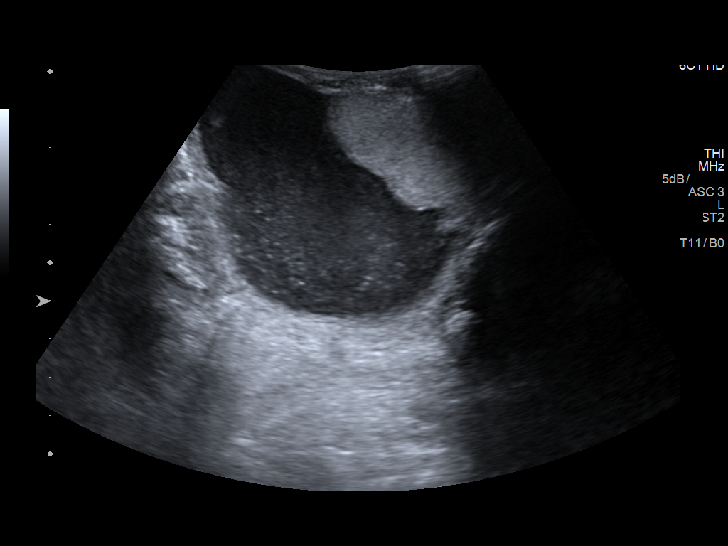

[13 of 25 positions shown; findings below may reference images not displayed]

FINDINGS: Right testicle

Measurements: 3.3 x 2.2 x 3.1 cm. No mass or microlithiasis
visualized.

Left testicle

Measurements: 3.6 x 1.5 x 2.9 cm. No mass or microlithiasis. The
testis is displaced anteriorly due to the large hydrocele.

Right epididymis: Normal in size and echotexture. 6 mm epididymal
head cyst.

Left epididymis:  Very poor visibility due to the complex hydrocele.

Hydrocele: Very large left hydrocele with internal debris this
measures over 10 cm. Small right hydrocele

Varicocele:  Small bilateral varicoceles.

Pulsed Doppler interrogation of both testes demonstrates normal low
resistance arterial and venous waveforms bilaterally.
IMPRESSION: Very large complex left hydrocele measuring over 10 cm, with
internal debris or hemorrhage. No intrinsic testicular abnormalities
are evident, but the left testis is displaced far anteriorly in
front of the hydrocele.

## 2020-01-13 ENCOUNTER — Ambulatory Visit: Payer: Self-pay | Attending: Internal Medicine

## 2020-01-13 DIAGNOSIS — Z23 Encounter for immunization: Secondary | ICD-10-CM

## 2020-01-13 NOTE — Progress Notes (Signed)
   Covid-19 Vaccination Clinic  Name:  Justin Newman    MRN: [id number] DOB: [birth year]  01/13/2020  [name] was observed post Covid-19 immunization for 15 minutes without incident. He was provided with Vaccine Information Sheet and instruction to access the V-Safe system.   [name] was instructed to call 911 with any severe reactions post vaccine: Marland Kitchen Difficulty breathing  . Swelling of face and throat  . A fast heartbeat  . A bad rash all over body  . Dizziness and weakness   Immunizations Administered    Name Date Dose VIS Date Route   Pfizer COVID-19 Vaccine 01/13/2020 11:04 AM 0.3 mL 10/25/2019 Intramuscular   Manufacturer: [place]   Lot: [id number]   [place]: [id number]

## 2020-10-03 ENCOUNTER — Telehealth: Payer: Self-pay

## 2020-10-03 ENCOUNTER — Other Ambulatory Visit (HOSPITAL_COMMUNITY): Payer: Self-pay

## 2020-10-03 NOTE — Telephone Encounter (Signed)
[facility] Patient Advocate Encounter ? ?Insurance verification completed.   ? ?The patient is uninsured and will need patient assistance for medication. ? ?We can complete the application and will need to meet with the patient for signatures and income documentation. ? ?Jerrian Mells, CPhT ?Specialty Pharmacy Patient Advocate ?[facility] ?Phone: [phone] ?Fax:  [phone]  ?

## 2020-10-07 ENCOUNTER — Ambulatory Visit (INDEPENDENT_AMBULATORY_CARE_PROVIDER_SITE_OTHER): Payer: Self-pay | Admitting: Internal Medicine

## 2020-10-07 ENCOUNTER — Other Ambulatory Visit (HOSPITAL_COMMUNITY)
Admission: RE | Admit: 2020-10-07 | Discharge: 2020-10-07 | Disposition: A | Discharge status: Home / Self Care | Payer: Self-pay | Source: Ambulatory Visit | Attending: Internal Medicine | Admitting: Internal Medicine

## 2020-10-07 ENCOUNTER — Encounter: Payer: Self-pay | Admitting: Internal Medicine

## 2020-10-07 ENCOUNTER — Other Ambulatory Visit: Payer: Self-pay | Admitting: Internal Medicine

## 2020-10-07 ENCOUNTER — Other Ambulatory Visit: Payer: Self-pay

## 2020-10-07 VITALS — BP 133/84 | HR 80 | Temp 98.2°F | Ht 71.0 in | Wt 194.0 lb

## 2020-10-07 DIAGNOSIS — Z113 Encounter for screening for infections with a predominantly sexual mode of transmission: Secondary | ICD-10-CM | POA: Insufficient documentation

## 2020-10-07 DIAGNOSIS — B1710 Acute hepatitis C without hepatic coma: Secondary | ICD-10-CM

## 2020-10-07 NOTE — Progress Notes (Addendum)
[facility]                                      [address], [place], [place], [address]                                               Phn. [phone]; Fax: [phone]                                                               Date:  Reason for Visit: Hepatitis C    HPI: Justin Newman is a 45 y.o.old male with newly diagnosed HCV infection. About 2 months ago pt was denied coverage for life insurance due to HCV infection. He was tested at [facility] on on 09/06/20 with HCV antibody reactive and RNA quant of 409,305. Social History: He is  in narcotics anonymous(20 years), 2.5 years sobriety. He has a history of IVDA with cocaine, last used cocaine (snorted/smoked) in 2018. Pt started smoking at the age of 65,  1PPD.  Both parents had HCV infection. Daughter has had HCV infection. Wife had a past HCV infection(recently diagnosed). He has a 45 year old daughter who is getting tested. Both parents and wife(now sober) has history of substance abuse.  Currently unemployed. 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 Incarcerated in 2012 and 2017. Pt was hospitalized in his teen for mental health. Pt has not been on mood stabilizers since 2016 by choice.   Denies h/o blood transfusion, sharing of toothbrushes/razors, Armed forces logistics/support/administrative officer.  He has not received treatment to date. Denies knowledge of prior  HCV testing.   Denies any hospitalizations related to liver disease, jaundice, ascites, GI bleeding, abdominal pain and acholic stool.   ROS: Rah on palms for a "while" Denies yellowish discoloration of sclera and skin, abdominal pain/distension, hematemesis.  Denies cough, fever, chills, nightsweats, nausea, vomiting, diarrhea, constipation, weight loss, recent hospitalizations, joint complaints, shortness of breath, chest pain, headaches, dysuria .  Current Outpatient Medications on File Prior to Visit  Medication Sig Dispense  Refill   calcium carbonate (TUMS - DOSED IN MG ELEMENTAL CALCIUM) 500 MG chewable tablet Chew 1 tablet by mouth as needed for indigestion or heartburn.     famotidine (PEPCID) 10 MG tablet Take 10 mg by mouth daily as needed for heartburn or indigestion.     ibuprofen (ADVIL,MOTRIN) 200 MG tablet Take 200 mg by mouth every 6 (six) hours as needed.     No current facility-administered medications on file prior to visit.    No Known Allergies  PMH: HCV, acid reflux, depression, bipolar  PSH:  Hydrocelectomy left scrotum 2017, 2018 Hernia repair: 1997  Social: Current smoker Cocaine abuse history  Family: HCV in mother and father DM father  Physical exam: BP 133/84   Pulse 80   Temp 98.2 F (36.8 C) (Oral)   Ht 5\' 11"  (1.803 m)   Wt 88 kg   SpO2 98%   BMI 27.06 kg/m   Gen: Alert and oriented x 3, no acute distress  HEENT: [place]/AT, PERL, EOMI, no scleral icterus, no pale conjunctivae, hearing normal, oral mucosa moist Neck: Supple, no lymphadenopathy Cardio: Regular rate and rhythm; +S1 and S2; no murmurs, gallops, or rubs Resp: CTAB; no wheezes, rhonchi, or rales GI: Soft, nontender, nondistended, bowel sounds present GU: Musc: Extremities: No cyanosis, clubbing, or edema; +2 PT and DP pulses Skin: + no pruritic palmar rash, lesions, or ecchymoses Neuro: No focal deficits Psych: Calm, cooperative  Assessment/Plan: Newly diagnosed HCV -Labs today: CBC w diff CMP, fibrotest HAV ab and HBV cAB, sAg serologies HIV order HCV genotype STI testing: syphilis, GC urine -Abdominal U/S with electrography -Follow-up in 3 weeks to review medication options and labs/imaging  Tobacco abuse -35 year smoking history -Started smoking after 6 months of cessation -Discussed smoking cessation  Hx of IVDA -Sober for 2.5 years, lasted snorted cocaine at that time -IVDA with cocaine 10+ years ago -Part of narcotics anonymous -Denies Hx of Etoh abuse    Counseling done on the  following -Natural progression of hep c, transmission (avoid sharing personal hygiene equipment), prevention, risks of left untreated and treatment options  -Avoid hepatotoxins like alcohol and excessive acetamaminphen (no more than 2 gram a day) -Avoid eating raw sea food -Risks of re-infection  -Hepatitis coinfection and vaccination( Pneumococcal vaccination in the cirrhotics)   Patient's labs were reviewed as well as his previous records. Patients questions were addressed and answered.   Electronically signed by:  Laurice Record, MD Infectious Diseases  Fax no. [phone]

## 2020-10-07 NOTE — Patient Instructions (Signed)
Labs today. Follow-up in 3 weeks to discuss medication options.

## 2020-10-08 LAB — MOLECULAR ANCILLARY ONLY
Chlamydia: NEGATIVE
Comment: NEGATIVE
Comment: NORMAL
Neisseria Gonorrhea: NEGATIVE

## 2020-10-11 LAB — COMPLETE METABOLIC PANEL WITH GFR
AG Ratio: 1.2 (calc) (ref 1.0–2.5)
ALT: 324 U/L — ABNORMAL HIGH (ref 9–46)
AST: 178 U/L — ABNORMAL HIGH (ref 10–40)
Albumin: 4.3 g/dL (ref 3.6–5.1)
Alkaline phosphatase (APISO): 74 U/L (ref 36–130)
BUN: 18 mg/dL (ref 7–25)
CO2: 24 mmol/L (ref 20–32)
Calcium: 10.6 mg/dL — ABNORMAL HIGH (ref 8.6–10.3)
Chloride: 104 mmol/L (ref 98–110)
Creat: 0.8 mg/dL (ref 0.60–1.29)
Globulin: 3.7 g/dL (calc) (ref 1.9–3.7)
Glucose, Bld: 143 mg/dL — ABNORMAL HIGH (ref 65–99)
Potassium: 4.6 mmol/L (ref 3.5–5.3)
Sodium: 137 mmol/L (ref 135–146)
Total Bilirubin: 0.5 mg/dL (ref 0.2–1.2)
Total Protein: 8 g/dL (ref 6.1–8.1)
eGFR: 111 mL/min/{1.73_m2} (ref >=60)

## 2020-10-11 LAB — HEPATITIS B SURFACE ANTIGEN: Hepatitis B Surface Ag: NONREACTIVE

## 2020-10-11 LAB — RPR: RPR Ser Ql: NONREACTIVE

## 2020-10-11 LAB — CBC WITH DIFFERENTIAL/PLATELET
Absolute Monocytes: 592 cells/uL (ref 200–950)
Basophils Absolute: 155 cells/uL (ref 0–200)
Basophils Relative: 1.7 %
Eosinophils Absolute: 191 cells/uL (ref 15–500)
Eosinophils Relative: 2.1 %
HCT: 49.2 % (ref 38.5–50.0)
Hemoglobin: 17.1 g/dL (ref 13.2–17.1)
Lymphs Abs: 3858 cells/uL (ref 850–3900)
MCH: 32 pg (ref 27.0–33.0)
MCHC: 34.8 g/dL (ref 32.0–36.0)
MCV: 92.1 fL (ref 80.0–100.0)
MPV: 10.1 fL (ref 7.5–12.5)
Monocytes Relative: 6.5 %
Neutro Abs: 4304 cells/uL (ref 1500–7800)
Neutrophils Relative %: 47.3 %
Platelets: 279 10*3/uL (ref 140–400)
RBC: 5.34 10*6/uL (ref 4.20–5.80)
RDW: 12.8 % (ref 11.0–15.0)
Total Lymphocyte: 42.4 %
WBC: 9.1 10*3/uL (ref 3.8–10.8)

## 2020-10-11 LAB — LIVER FIBROSIS, FIBROTEST-ACTITEST
ALT: 334 U/L — ABNORMAL HIGH (ref 9–46)
Alpha-2-Macroglobulin: 403 mg/dL — ABNORMAL HIGH (ref 106–279)
Apolipoprotein A1: 96 mg/dL (ref 94–176)
Bilirubin: 0.6 mg/dL (ref 0.2–1.2)
Fibrosis Score: 0.83
GGT: 86 U/L (ref 3–95)
Haptoglobin: 63 mg/dL (ref 43–212)
Necroinflammat ACT Score: 0.96
Reference ID: 4054442

## 2020-10-11 LAB — PROTIME-INR
INR: 1
Prothrombin Time: 10.5 s (ref 9.0–11.5)

## 2020-10-11 LAB — HEPATITIS C GENOTYPE: HCV Genotype: 3

## 2020-10-11 LAB — HEPATITIS A ANTIBODY, TOTAL: Hepatitis A AB,Total: NONREACTIVE

## 2020-10-11 LAB — HEPATITIS B CORE ANTIBODY, IGM: Hep B C IgM: NONREACTIVE

## 2020-10-11 LAB — HIV ANTIBODY (ROUTINE TESTING W REFLEX): HIV 1&2 Ab, 4th Generation: NONREACTIVE

## 2020-10-14 ENCOUNTER — Ambulatory Visit (HOSPITAL_COMMUNITY)
Admission: RE | Admit: 2020-10-14 | Discharge: 2020-10-14 | Disposition: A | Discharge status: Home / Self Care | Payer: Self-pay | Source: Ambulatory Visit | Attending: Internal Medicine | Admitting: Internal Medicine

## 2020-10-14 ENCOUNTER — Other Ambulatory Visit: Payer: Self-pay

## 2020-10-14 DIAGNOSIS — B1710 Acute hepatitis C without hepatic coma: Secondary | ICD-10-CM | POA: Insufficient documentation

## 2020-10-15 NOTE — Progress Notes (Signed)
Did you want me to wait on sending his patient assistance application out ?

## 2020-10-16 ENCOUNTER — Telehealth: Payer: Self-pay

## 2020-10-16 NOTE — Telephone Encounter (Signed)
[facility] Patient Advocate Encounter  Completed and sent MYABBVIE application for Mavyret for this patient who is uninsured.    Patient assistance phone number for follow up is [phone].   This encounter will be updated until final determination.   Ileene Patrick, [facility] Patient [facility] for [facility] Phone: [phone] Fax:  [phone]

## 2020-10-28 ENCOUNTER — Other Ambulatory Visit: Payer: Self-pay

## 2020-10-28 ENCOUNTER — Ambulatory Visit (INDEPENDENT_AMBULATORY_CARE_PROVIDER_SITE_OTHER): Payer: Self-pay | Admitting: Internal Medicine

## 2020-10-28 DIAGNOSIS — K7460 Unspecified cirrhosis of liver: Secondary | ICD-10-CM

## 2020-10-28 NOTE — Patient Instructions (Signed)
Pharmacy will call you when medications arrive. You will need come in for counseling. Following start of medications 4 week follow-up with pharmacy End of treatment follow-up with Dr. Candiss Norse

## 2020-10-28 NOTE — Progress Notes (Signed)
[facility]                                      [address], [place], [place], [address]                                               Phn. [phone]; Fax: [phone]  Reason for Visit: Hepatitis C    HPI: Justin Newman is a 45 y.o.old male with HCV follow-up. He is ready to start treatment. Denies abdominal pain, rashes, jaundice, N,V,D. Continue to follow with Narcotics Anonymous.    Current Outpatient Medications on File Prior to Visit  Medication Sig Dispense Refill   calcium carbonate (TUMS - DOSED IN MG ELEMENTAL CALCIUM) 500 MG chewable tablet Chew 1 tablet by mouth as needed for indigestion or heartburn.     famotidine (PEPCID) 10 MG tablet Take 10 mg by mouth daily as needed for heartburn or indigestion.     ibuprofen (ADVIL,MOTRIN) 200 MG tablet Take 200 mg by mouth every 6 (six) hours as needed.     No current facility-administered medications on file prior to visit.   No Known Allergies  Past Medical History:  Diagnosis Date   GERD (gastroesophageal reflux disease)    Hydrocele, left    RECURRENT    Past Surgical History:  Procedure Laterality Date   HYDROCELE EXCISION Left 10/18/2015   Procedure: HYDROCELECTOMY ADULT;  Surgeon: Cleon Gustin, MD;  Location: [facility];  Service: Urology;  Laterality: Left;   HYDROCELE EXCISION Left 03/02/2016   Procedure: HYDROCELECTOMY ADULT;  Surgeon: Cleon Gustin, MD;  Location: [facility];  Service: Urology;  Laterality: Left;   INGUINAL HERNIA REPAIR Right 1995  aprrox    Social History   Socioeconomic History   Marital status: Married    Spouse name: Not on file   Number of children: Not on file   Years of education: Not on file   Highest education level: Not on file  Occupational History   Not on file  Tobacco Use   Smoking status: Former    Packs/day: 1.00    Years: 30.00    Pack years: 30.00    Types: Cigarettes    Smokeless tobacco: Former    Types: Snuff  Substance and Sexual Activity   Alcohol use: No    Comment: no alcohol 2.5 years 10/06/20   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  Gen: Alert and oriented x 3, no acute distress HEENT: [place]/AT, PERL, EOMI, no scleral icterus, no pale conjunctivae, hearing normal, oral mucosa moist Neck: Supple, no lymphadenopathy Cardio: Regular rate and rhythm; +S1 and S2; no murmurs, gallops,  or rubs Resp: CTAB; no wheezes, rhonchi, or rales GI: Soft, nontender, nondistended, bowel sounds present GU: Musc: Extremities: No cyanosis, clubbing, or edema; +2 PT and DP pulses Skin: No rashes, lesions, or ecchymoses Neuro: No focal deficits Psych: Calm, cooperative   Laboratory  CBC w diff-ordered 10/07/20 CMP-ordered 10/07/20 PT/PTT/INR-ordered 10/07/20 Hep A and B serologies -  HAV non immune, ordered HBV Sab HIV-negative 10/07/20 HCV genotype-3 HCV RNA-ordered today 10/28/20 Measure of fibrosis-F4     Assessment/Plan:  #HCV infection-chronic -Stage F4, U/S consistent with cirrhosis -Genotype 3 Plan: -Will get HBV Sab and HCV RNA today -Pt to return to pick up [place] and visit with pharmacy. Start Mavyret x8 weeks. Follow-up with pharmacy 4 weeks from starting treatment and with me at the end of treatment(8 weeks) -Liver U/S every 6  months(last one on 10/14/20)  -Referral to GI for EGD to look for verices  Tobacco abuse -35 year smoking history -Started smoking  after 6 months of cessation -Discussed smoking cessation   Hx of IVDA -Sober for 2.5 years, lasted snorted cocaine at that time -IVDA with cocaine 10+ years ago -Part of narcotics anonymous -Denies Hx of Etoh abuse       Counseling done on the following -Natural progression of hep c, transmission (avoid sharing personal hygiene equipment), prevention, risks of left untreated and treatment options  -Avoid hepatotoxins like alcohol and excessive acetamaminphen (no more than 2 gram a day) -Avoid eating raw sea food -Risks of re-infection  -Hepatitis coinfection and vaccination( Pneumococcal vaccination in the cirrhotics)    Electronically signed by:  Laurice Record, MD Infectious Diseases  Fax no. [phone]

## 2020-10-30 ENCOUNTER — Telehealth: Payer: Self-pay

## 2020-10-30 NOTE — Telephone Encounter (Signed)
[facility] Patient Advocate Encounter  Completed and sent MYABBVIE application for Mavyret for this patient who is uninsured.    Patient is approved 10/30/20 through 04/30/21.  Once I get delivery date on medication I will contact the patient and update HCV & 1 month follow up.   Ileene Patrick, [facility] Patient [facility] for Infectious Disease Phone: [phone] Fax:  [phone]

## 2020-11-04 LAB — HCV RNA,LIPA RFLX NS5A DRUG RESIST: HCV Genotype: 3

## 2020-11-04 LAB — HCV RNA, QUANT REAL-TIME PCR W/REFLEX
HCV RNA, PCR, QN (Log): 7.03 LogIU/mL — ABNORMAL HIGH
HCV RNA, PCR, QN: 10700000 IU/mL — ABNORMAL HIGH

## 2020-11-04 LAB — HEPATITIS B SURFACE ANTIBODY,QUALITATIVE: Hep B S Ab: NONREACTIVE

## 2020-11-05 ENCOUNTER — Telehealth: Payer: Self-pay

## 2020-11-05 NOTE — Telephone Encounter (Signed)
Patient is approved to receive Mavyret x 8 weeks for chronic Hepatitis C infection. Counseled patient to take all three tablets of Mavyret daily with food.  Counseled patient the need to take all three tablets together and to not separate them out during the day. Encouraged patient not to miss any doses and explained how their chance of cure could go down with each dose missed. Counseled patient on what to do if dose is missed - if it is closer to the missed dose take immediately; if closer to next dose then skip dose and take the next dose at the usual time. Counseled patient on common side effects such as headache, fatigue, and nausea and that these normally decrease with time. I reviewed patient medications and found no drug interactions. Discussed with patient that there are several drug interactions with Mavyret and instructed patient to call the clinic if he wishes to start a new medication during course of therapy. Also advised patient to call if he experiences any side effects. Patient will follow-up with Dr. Candiss Norse in the clinic on 11/27/20.   Lestine Box, PharmD PGY2 Infectious Diseases Pharmacy Resident

## 2020-11-05 NOTE — Progress Notes (Signed)
[place] ,  Patient is approved his medication should come today, I spoke to him yesterday and I told him I will call him so he can come pick up. He would like to ask some questions about the medication when he come so I will have him talk to Darrelyn Hillock.

## 2020-11-05 NOTE — Telephone Encounter (Signed)
[facility] Patient Advocate Encounter  Patient's medications have been couriered to [facility] from [place] and will be picked up 11/05/20.  Ileene Patrick , [facility] Patient [facility] for Infectious Disease Phone: [phone] Fax:  [phone]

## 2020-11-21 ENCOUNTER — Telehealth: Payer: Self-pay

## 2020-11-21 NOTE — Telephone Encounter (Signed)
[facility] Patient Advocate Encounter  Patient's medications have been couriered to [facility] from [place] and will be picked up 11/27/20.  Ileene Patrick , [facility] Patient [facility] for Infectious Disease Phone: [phone] Fax:  [phone]

## 2020-11-27 ENCOUNTER — Other Ambulatory Visit: Payer: Self-pay

## 2020-11-27 ENCOUNTER — Encounter: Payer: Self-pay | Admitting: Internal Medicine

## 2020-11-27 ENCOUNTER — Ambulatory Visit (INDEPENDENT_AMBULATORY_CARE_PROVIDER_SITE_OTHER): Payer: Self-pay | Admitting: Internal Medicine

## 2020-11-27 VITALS — BP 135/81 | HR 68 | Temp 98.7°F | Wt 188.8 lb

## 2020-11-27 DIAGNOSIS — Z23 Encounter for immunization: Secondary | ICD-10-CM

## 2020-11-27 DIAGNOSIS — B182 Chronic viral hepatitis C: Secondary | ICD-10-CM

## 2020-11-27 NOTE — Progress Notes (Signed)
[facility]                                      [address], [place], [place], [address]                                               Phn. [phone]; Fax: [phone]                                                               Date:  Reason for Visit: Hepatitis C    HPI: Justin Newman is a 45 y.o.old male with chronic HCV presents for HCV follow-up. He started Mavyret 4 weeks ago and reports 100% adherence. He denies any abdominal pina, N,V,D.   Current Outpatient Medications on File Prior to Visit  Medication Sig Dispense Refill   calcium carbonate (TUMS - DOSED IN MG ELEMENTAL CALCIUM) 500 MG chewable tablet Chew 1 tablet by mouth as needed for indigestion or heartburn.     famotidine (PEPCID) 10 MG tablet Take 10 mg by mouth daily as needed for heartburn or indigestion.     ibuprofen (ADVIL,MOTRIN) 200 MG tablet Take 200 mg by mouth every 6 (six) hours as needed.     No current facility-administered medications on file prior to visit.   No Known Allergies  Past Medical History:  Diagnosis Date   GERD (gastroesophageal reflux disease)    Hydrocele, left    RECURRENT    Past Surgical History:  Procedure Laterality Date   HYDROCELE EXCISION Left 10/18/2015   Procedure: HYDROCELECTOMY ADULT;  Surgeon: Cleon Gustin, MD;  Location: [facility];  Service: Urology;  Laterality: Left;   HYDROCELE EXCISION Left 03/02/2016   Procedure: HYDROCELECTOMY ADULT;  Surgeon: Cleon Gustin, MD;  Location[facility];  Service: Urology;  Laterality: Left;   INGUINAL HERNIA REPAIR Right 1995  aprrox    Social History   Socioeconomic History   Marital status: Married    Spouse name: Not on file   Number of children: Not on file   Years of education: Not on file   Highest education level: Not on file  Occupational History   Not on file  Tobacco Use   Smoking status: Every Day    Packs/day:  0.50    Years: 30.00    Pack years: 15.00    Types: Cigarettes   Smokeless tobacco: Former    Types: Snuff  Substance and Sexual Activity   Alcohol use: No    Comment: no alcohol 2.5 years 10/06/20   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  Physical exam: BP 135/81 (BP Location: Right Arm)   Pulse 68   Temp 98.7 F (37.1 C) (Temporal)   Wt 85.6 kg   BMI 26.33 kg/m   Gen: Alert and oriented x 3, no acute distress HEENT: [place]/AT, PERL, EOMI, no scleral icterus, no pale conjunctivae, hearing normal, oral mucosa moist Neck: Supple, no lymphadenopathy Cardio: Regular rate and rhythm; +S1 and S2; no murmurs, gallops, or rubs Resp: CTAB; no wheezes, rhonchi, or rales GI: Soft, nontender, nondistended, bowel sounds present GU: Musc: Extremities: No cyanosis, clubbing, or edema; +2 PT and DP pulses Skin: No rashes, lesions, or ecchymoses Neuro: No focal deficits Psych: Calm, cooperative     Assessment/Plan: #HCV infection-chronic -Stage F4, U/S consistent with cirrhosis -Genotype 3 -On week 4/8 of Mavyret, reports 100% adherence Plan: -Hep A and B Immunization -Remaining course of Mavyret given -Labs today: cbc, cmp PT/INR -Pt will return to clinic in 4 weeks for end of treatment follow up.   Counseling done on the following -Natural progression of hep c, transmission (avoid sharing personal hygiene equipment), prevention, risks of left untreated and treatment options  -Avoid hepatotoxins like alcohol and excessive  acetamaminphen (no more than 2 gram a day) -Avoid eating raw sea food -Risks of re-infection  -Hepatitis coinfection and vaccination( Pneumococcal vaccination in the cirrhotics    - [place] screening with [place] every 6 months - EGD to r/o varices in cirrhotics-referral to GI ordred    Electronically signed by:  Laurice Record, MD Infectious Diseases  Fax no. [phone]

## 2020-11-28 LAB — CBC WITH DIFFERENTIAL/PLATELET
Absolute Monocytes: 525 cells/uL (ref 200–950)
Basophils Absolute: 126 cells/uL (ref 0–200)
Basophils Relative: 1.2 %
Eosinophils Absolute: 116 cells/uL (ref 15–500)
Eosinophils Relative: 1.1 %
HCT: 45.4 % (ref 38.5–50.0)
Hemoglobin: 15.5 g/dL (ref 13.2–17.1)
Lymphs Abs: 4547 cells/uL — ABNORMAL HIGH (ref 850–3900)
MCH: 31.7 pg (ref 27.0–33.0)
MCHC: 34.1 g/dL (ref 32.0–36.0)
MCV: 92.8 fL (ref 80.0–100.0)
MPV: 10.1 fL (ref 7.5–12.5)
Monocytes Relative: 5 %
Neutro Abs: 5187 cells/uL (ref 1500–7800)
Neutrophils Relative %: 49.4 %
Platelets: 303 10*3/uL (ref 140–400)
RBC: 4.89 10*6/uL (ref 4.20–5.80)
RDW: 12.9 % (ref 11.0–15.0)
Total Lymphocyte: 43.3 %
WBC: 10.5 10*3/uL (ref 3.8–10.8)

## 2020-11-28 LAB — COMPLETE METABOLIC PANEL WITH GFR
AG Ratio: 1.4 (calc) (ref 1.0–2.5)
ALT: 34 U/L (ref 9–46)
AST: 28 U/L (ref 10–40)
Albumin: 4.6 g/dL (ref 3.6–5.1)
Alkaline phosphatase (APISO): 65 U/L (ref 36–130)
BUN: 18 mg/dL (ref 7–25)
CO2: 26 mmol/L (ref 20–32)
Calcium: 9.6 mg/dL (ref 8.6–10.3)
Chloride: 103 mmol/L (ref 98–110)
Creat: 0.91 mg/dL (ref 0.60–1.29)
Globulin: 3.3 g/dL (calc) (ref 1.9–3.7)
Glucose, Bld: 96 mg/dL (ref 65–99)
Potassium: 4.2 mmol/L (ref 3.5–5.3)
Sodium: 137 mmol/L (ref 135–146)
Total Bilirubin: 0.8 mg/dL (ref 0.2–1.2)
Total Protein: 7.9 g/dL (ref 6.1–8.1)
eGFR: 106 mL/min/{1.73_m2} (ref >=60)

## 2020-11-28 LAB — PROTIME-INR
INR: 1
Prothrombin Time: 10.6 s (ref 9.0–11.5)

## 2020-12-25 ENCOUNTER — Other Ambulatory Visit: Payer: Self-pay

## 2020-12-25 ENCOUNTER — Encounter: Payer: Self-pay | Admitting: Internal Medicine

## 2020-12-25 ENCOUNTER — Ambulatory Visit (INDEPENDENT_AMBULATORY_CARE_PROVIDER_SITE_OTHER): Payer: Self-pay | Admitting: Internal Medicine

## 2020-12-25 VITALS — BP 141/82 | HR 74 | Temp 97.7°F | Ht 71.0 in | Wt 191.0 lb

## 2020-12-25 DIAGNOSIS — B182 Chronic viral hepatitis C: Secondary | ICD-10-CM

## 2020-12-25 DIAGNOSIS — Z23 Encounter for immunization: Secondary | ICD-10-CM

## 2020-12-25 NOTE — Progress Notes (Signed)
Reason for Visit: Hepatitis C      HPI: Justin Newman is a 45 y.o.old male with chronic HCV presents for HCV follow-up. He started Mavyret 7 weeks ago and reports 100% adherence. He denies any abdominal pain, N,V,D.  Patient's Medications  New Prescriptions   No medications on file  Previous Medications   CALCIUM CARBONATE (TUMS - DOSED IN MG ELEMENTAL CALCIUM) 500 MG CHEWABLE TABLET    Chew 1 tablet by mouth as needed for indigestion or heartburn.   FAMOTIDINE (PEPCID) 10 MG TABLET    Take 10 mg by mouth daily as needed for heartburn or indigestion.   IBUPROFEN (ADVIL,MOTRIN) 200 MG TABLET    Take 200 mg by mouth every 6 (six) hours as needed.  Modified Medications   No medications on file  Discontinued Medications   No medications on file     Review of Systems: Review of Systems  All other systems reviewed and are negative.  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  No Known Allergies  Health Maintenance  Topic Date Due   Pneumococcal Vaccine 62-45 Years old (1 - PCV) Never done   COLONOSCOPY (Pts 45-52yrs Insurance coverage will need to be confirmed)  Never done   COVID-19 Vaccine (5 - Booster) 03/09/2020   INFLUENZA VACCINE  Never done   TETANUS/TDAP  05/23/2025   Hepatitis C Screening  Completed   HIV Screening  Completed   HPV VACCINES  Aged Out    Objective:  There were no vitals filed for this visit. There is no height or weight on file to calculate BMI.  Physical Exam Constitutional:      General: He is not in acute  distress.    Appearance: He is normal weight. He is not toxic-appearing.  HENT:     Head: Normocephalic and atraumatic.     Right Ear: External ear normal.     Left Ear: External ear normal.     Nose: No congestion or rhinorrhea.     Mouth/Throat:     Mouth: Mucous membranes are moist.     Pharynx: Oropharynx is clear.  Eyes:     Extraocular Movements: Extraocular movements intact.     Conjunctiva/sclera: Conjunctivae normal.     Pupils: Pupils are equal, round, and reactive to light.  Cardiovascular:     Rate and Rhythm: Normal rate and regular rhythm.     Heart sounds: No murmur heard.   No friction rub. No gallop.  Pulmonary:     Effort: Pulmonary effort is normal.     Breath sounds: Normal breath sounds.  Abdominal:     General: Abdomen is flat. Bowel sounds are normal.     Palpations: Abdomen is soft.  Musculoskeletal:        General: No swelling. Normal range of motion.     Cervical back: Normal range of motion and neck  supple.  Skin:    General: Skin is warm and dry.  Neurological:     General: No focal deficit present.     Mental Status: He is oriented to person, place, and time.  Psychiatric:        Mood and Affect: Mood normal.    Lab Results Lab Results  Component Value Date   WBC 10.5 11/27/2020   HGB 15.5 11/27/2020   HCT 45.4 11/27/2020   MCV 92.8 11/27/2020   PLT 303 11/27/2020    Lab Results  Component Value Date   CREATININE 0.91 11/27/2020   BUN 18 11/27/2020   NA 137 11/27/2020   K 4.2 11/27/2020   CL 103 11/27/2020   CO2 26 11/27/2020    Lab Results  Component Value Date   ALT 34 11/27/2020   AST 28 11/27/2020   GGT 86 10/07/2020   ALKPHOS 75 02/09/2017   BILITOT 0.8 11/27/2020    No results found for: CHOL, HDL, LDLCALC, LDLDIRECT, TRIG, CHOLHDL Lab Results  Component Value Date   LABRPR NON-REACTIVE 10/07/2020   No results found for: HIV1RNAQUANT, HIV1RNAVL, CD4TABS   Assessment/Plan: #HCV infection-chronic -Stage F4, U/S  consistent with cirrhosis -Genotype 3 -Completed 7/8 weeks of Mavyret Plan: -Labs today: cbc, cmp HCV RNA -Pt will return to clinic in 12 weeks for SVR. HBV 2nd dose at next visit. Needs 2nd dose of HAV vaccine in >5 months.     Counseling done on the following -Natural progression of hep c, transmission (avoid sharing personal hygiene equipment), prevention, risks of left untreated and treatment options  -Avoid hepatotoxins like alcohol and excessive acetamaminphen (no more than 2 gram a day) -Avoid eating raw sea food -Risks of re-infection  -Hepatitis coinfection and vaccination( Pneumococcal vaccination in the cirrhotics      - [place] screening with [place] every 6 months - EGD to r/o varices in cirrhotics-referral to GI ordred  Laurice Record, [place] [facility] [place] 12/25/2020, 9:39 AM

## 2020-12-28 LAB — CBC WITH DIFFERENTIAL/PLATELET
Absolute Monocytes: 711 cells/uL (ref 200–950)
Basophils Absolute: 114 cells/uL (ref 0–200)
Basophils Relative: 0.9 %
Eosinophils Absolute: 165 cells/uL (ref 15–500)
Eosinophils Relative: 1.3 %
HCT: 46 % (ref 38.5–50.0)
Hemoglobin: 16.4 g/dL (ref 13.2–17.1)
Lymphs Abs: 4496 cells/uL — ABNORMAL HIGH (ref 850–3900)
MCH: 32.8 pg (ref 27.0–33.0)
MCHC: 35.7 g/dL (ref 32.0–36.0)
MCV: 92 fL (ref 80.0–100.0)
MPV: 9.8 fL (ref 7.5–12.5)
Monocytes Relative: 5.6 %
Neutro Abs: 7214 cells/uL (ref 1500–7800)
Neutrophils Relative %: 56.8 %
Platelets: 303 10*3/uL (ref 140–400)
RBC: 5 10*6/uL (ref 4.20–5.80)
RDW: 12.8 % (ref 11.0–15.0)
Total Lymphocyte: 35.4 %
WBC: 12.7 10*3/uL — ABNORMAL HIGH (ref 3.8–10.8)

## 2020-12-28 LAB — COMPLETE METABOLIC PANEL WITH GFR
AG Ratio: 1.3 (calc) (ref 1.0–2.5)
ALT: 32 U/L (ref 9–46)
AST: 24 U/L (ref 10–40)
Albumin: 4.5 g/dL (ref 3.6–5.1)
Alkaline phosphatase (APISO): 67 U/L (ref 36–130)
BUN: 19 mg/dL (ref 7–25)
CO2: 26 mmol/L (ref 20–32)
Calcium: 9.9 mg/dL (ref 8.6–10.3)
Chloride: 104 mmol/L (ref 98–110)
Creat: 0.85 mg/dL (ref 0.60–1.29)
Globulin: 3.4 g/dL (calc) (ref 1.9–3.7)
Glucose, Bld: 147 mg/dL — ABNORMAL HIGH (ref 65–99)
Potassium: 4.8 mmol/L (ref 3.5–5.3)
Sodium: 136 mmol/L (ref 135–146)
Total Bilirubin: 1 mg/dL (ref 0.2–1.2)
Total Protein: 7.9 g/dL (ref 6.1–8.1)
eGFR: 109 mL/min/{1.73_m2} (ref >=60)

## 2020-12-28 LAB — HEPATITIS C RNA QUANTITATIVE
HCV Quantitative Log: <1.18 log IU/mL — ABNORMAL HIGH
HCV RNA, PCR, QN: <15 IU/mL — ABNORMAL HIGH

## 2021-02-03 ENCOUNTER — Other Ambulatory Visit: Payer: Self-pay | Admitting: Internal Medicine

## 2021-02-03 ENCOUNTER — Telehealth: Payer: Self-pay

## 2021-02-03 ENCOUNTER — Ambulatory Visit (INDEPENDENT_AMBULATORY_CARE_PROVIDER_SITE_OTHER): Payer: Self-pay | Admitting: Internal Medicine

## 2021-02-03 ENCOUNTER — Other Ambulatory Visit: Payer: Self-pay

## 2021-02-03 ENCOUNTER — Other Ambulatory Visit (HOSPITAL_COMMUNITY)
Admission: RE | Admit: 2021-02-03 | Discharge: 2021-02-03 | Disposition: A | Discharge status: Home / Self Care | Payer: Self-pay | Source: Ambulatory Visit | Attending: Internal Medicine | Admitting: Internal Medicine

## 2021-02-03 ENCOUNTER — Encounter: Payer: Self-pay | Admitting: Internal Medicine

## 2021-02-03 VITALS — BP 151/92 | HR 83 | Temp 97.5°F | Resp 16 | Ht 71.0 in | Wt 195.6 lb

## 2021-02-03 DIAGNOSIS — J069 Acute upper respiratory infection, unspecified: Secondary | ICD-10-CM

## 2021-02-03 MED ORDER — AMOXICILLIN-POT CLAVULANATE 875-125 MG PO TABS: 1.0000 | ORAL_TABLET | Freq: Two times a day (BID) | ORAL | 0 refills | Status: DC

## 2021-02-03 NOTE — Progress Notes (Signed)
There are no problems to display for this patient.   Patient's Medications  New Prescriptions   No medications on file  Previous Medications   CALCIUM CARBONATE (TUMS - DOSED IN MG ELEMENTAL CALCIUM) 500 MG CHEWABLE TABLET    Chew 1 tablet by mouth as needed for indigestion or heartburn.   FAMOTIDINE (PEPCID) 10 MG TABLET    Take 10 mg by mouth daily as needed for heartburn or indigestion.   IBUPROFEN (ADVIL,MOTRIN) 200 MG TABLET    Take 200 mg by mouth every 6 (six) hours as needed.  Modified Medications   No medications on file  Discontinued Medications   No medications on file    Subjective: 51 YM with Hx of chronic HCV infection with cirrhosis SP mavyret x 8 weeks(last dose at the end of December, HCV VL <15, on 12/25/20) presents with complaint of rash on b/l palm of hands, fever and ear fullness.  Symptoms as below:/ -3 weeks ago pt started experiencing worsening HA,  chills and nausea that lasted 2 weeks. -This week pt started having a productive cough, sniffles. Had fever for the past week with rhinorrhea, tamx 101. Pruritis rash on palms and soles on since Wednesday.  Pt had a rash on hands in the past. He started having rashes in the past after viral illnesses. He thinks it may be related to his diet.  Review of Systems: Review of Systems  All other systems reviewed and are negative.  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  No Known Allergies  Health Maintenance  Topic Date Due   COLONOSCOPY (Pts 45-49yrs Insurance coverage will  need to be confirmed)  Never done   COVID-19 Vaccine (5 - Booster) 03/09/2020   TETANUS/TDAP  05/23/2025   INFLUENZA VACCINE  Completed   Hepatitis C Screening  Completed   HIV Screening  Completed   HPV VACCINES  Aged Out    Objective:  Vitals:   02/03/21 1109  BP: (!) 151/92  Pulse: 83  Resp: 16  Temp: (!) 97.5 F (36.4 C)  TempSrc: Temporal  SpO2: 99%  Weight: 195 lb 9.6 oz (88.7 kg)  Height: 5\' 11"  (1.803 m)   Body mass index is 27.28 kg/m.  Physical Exam Constitutional:      General: He is not in acute distress.    Appearance: He is normal weight. He is not toxic-appearing.  HENT:     Head: Normocephalic and atraumatic.     Right Ear: External ear normal.     Left Ear: External ear normal.     Ears:     Comments: B/l dull tympanic memebranes    Nose: No congestion or rhinorrhea.     Mouth/Throat:     Mouth: Mucous membranes are moist.     Pharynx: Oropharynx is clear.  Eyes:     Extraocular Movements: Extraocular movements intact.  Conjunctiva/sclera: Conjunctivae normal.     Pupils: Pupils are equal, round, and reactive to light.  Cardiovascular:     Rate and Rhythm: Normal rate and regular rhythm.     Heart sounds: No murmur heard.   No friction rub. No gallop.  Pulmonary:     Effort: Pulmonary effort is normal.     Breath sounds: Normal breath sounds.  Abdominal:     General: Abdomen is flat. Bowel sounds are normal.     Palpations: Abdomen is soft.  Musculoskeletal:        General: No swelling. Normal range of motion.     Cervical back: Normal range of motion and neck supple.  Skin:    General: Skin is warm and dry.     Comments: [place] rash  Neurological:     General: No focal deficit present.     Mental Status: He is oriented to person, place, and time.  Psychiatric:        Mood and Affect: Mood normal.    Lab Results Lab Results  Component Value Date   WBC 12.7 (H) 12/25/2020   HGB 16.4 12/25/2020   HCT 46.0 12/25/2020   MCV 92.0  12/25/2020   PLT 303 12/25/2020    Lab Results  Component Value Date   CREATININE 0.85 12/25/2020   BUN 19 12/25/2020   NA 136 12/25/2020   K 4.8 12/25/2020   CL 104 12/25/2020   CO2 26 12/25/2020    Lab Results  Component Value Date   ALT 32 12/25/2020   AST 24 12/25/2020   GGT 86 10/07/2020   ALKPHOS 75 02/09/2017   BILITOT 1.0 12/25/2020    No results found for: CHOL, HDL, LDLCALC, LDLDIRECT, TRIG, CHOLHDL Lab Results  Component Value Date   LABRPR NON-REACTIVE 10/07/2020   No results found for: HIV1RNAQUANT, HIV1RNAVL, CD4TABS   #Otitis media -Dull tympanic membranes on exam. Will Rx Augmentin> suspect this is a sequela of viral uri.  -Rule out other etiology: throat Cx, COVID, RVP, HIV, GC urine -Follow-up with me at the next follow-up appointment or sooner if needed.   #Palmar rash -minimal rash on medial foot -It appears pt gets rash on palms of his hands for years after illnesses. I will refer to dermatology. Rash can be related to cirrhosis. I dont think it PCT as that usually presents on dorsum of hands.  -RPR  Laurice Record, MD [place] [facility] 02/03/2021, 11:21 AM

## 2021-02-03 NOTE — Telephone Encounter (Signed)
Patient states he currently being treated Hep C and has noticed for the past few weeks he has experienced some nausea, coughing, headaches. Patient states he has taking multiple covid test (recently on 02/02/21) all were negative. Patient then states he does have new rashes to bilateral hands and feet. Patient accepts appointment for today with Dr. Candiss Norse.  Eugenia Mcalpine

## 2021-02-04 ENCOUNTER — Encounter: Payer: Self-pay | Admitting: Gastroenterology

## 2021-02-04 LAB — MOLECULAR ANCILLARY ONLY
Chlamydia: NEGATIVE
Comment: NEGATIVE
Comment: NORMAL
Neisseria Gonorrhea: NEGATIVE

## 2021-02-04 LAB — SARS-COV-2 RNA,(COVID-19) QUALITATIVE NAAT: SARS CoV2 RNA: NOT DETECTED

## 2021-02-07 LAB — RESPIRATORY VIRUS PANEL
Adenovirus B: NOT DETECTED
HUMAN PARAINFLU VIRUS 1: NOT DETECTED
HUMAN PARAINFLU VIRUS 2: NOT DETECTED
HUMAN PARAINFLU VIRUS 3: NOT DETECTED
INFLUENZA A SUBTYPE H1: NOT DETECTED
INFLUENZA A SUBTYPE H3: NOT DETECTED
Influenza A: NOT DETECTED
Influenza B: NOT DETECTED
Metapneumovirus: NOT DETECTED
Respiratory Syncytial Virus A: NOT DETECTED
Respiratory Syncytial Virus B: NOT DETECTED
Rhinovirus: DETECTED — AB

## 2021-02-07 LAB — AEROBIC CULTURE
MICRO NUMBER:: 12938305
SPECIMEN QUALITY:: ADEQUATE

## 2021-02-08 LAB — CBC WITH DIFFERENTIAL/PLATELET
Absolute Monocytes: 882 cells/uL (ref 200–950)
Basophils Absolute: 118 cells/uL (ref 0–200)
Basophils Relative: 0.8 %
Eosinophils Absolute: 162 cells/uL (ref 15–500)
Eosinophils Relative: 1.1 %
HCT: 47.2 % (ref 38.5–50.0)
Hemoglobin: 16.5 g/dL (ref 13.2–17.1)
Lymphs Abs: 4454 cells/uL — ABNORMAL HIGH (ref 850–3900)
MCH: 32.4 pg (ref 27.0–33.0)
MCHC: 35 g/dL (ref 32.0–36.0)
MCV: 92.7 fL (ref 80.0–100.0)
MPV: 10 fL (ref 7.5–12.5)
Monocytes Relative: 6 %
Neutro Abs: 9085 cells/uL — ABNORMAL HIGH (ref 1500–7800)
Neutrophils Relative %: 61.8 %
Platelets: 309 10*3/uL (ref 140–400)
RBC: 5.09 10*6/uL (ref 4.20–5.80)
RDW: 12.3 % (ref 11.0–15.0)
Total Lymphocyte: 30.3 %
WBC: 14.7 10*3/uL — ABNORMAL HIGH (ref 3.8–10.8)

## 2021-02-08 LAB — COMPLETE METABOLIC PANEL WITH GFR
AG Ratio: 1.3 (calc) (ref 1.0–2.5)
ALT: 29 U/L (ref 9–46)
AST: 22 U/L (ref 10–40)
Albumin: 4.7 g/dL (ref 3.6–5.1)
Alkaline phosphatase (APISO): 82 U/L (ref 36–130)
BUN: 15 mg/dL (ref 7–25)
CO2: 28 mmol/L (ref 20–32)
Calcium: 9.9 mg/dL (ref 8.6–10.3)
Chloride: 102 mmol/L (ref 98–110)
Creat: 0.8 mg/dL (ref 0.60–1.29)
Globulin: 3.6 g/dL (calc) (ref 1.9–3.7)
Glucose, Bld: 110 mg/dL — ABNORMAL HIGH (ref 65–99)
Potassium: 4.3 mmol/L (ref 3.5–5.3)
Sodium: 136 mmol/L (ref 135–146)
Total Bilirubin: 0.7 mg/dL (ref 0.2–1.2)
Total Protein: 8.3 g/dL — ABNORMAL HIGH (ref 6.1–8.1)
eGFR: 111 mL/min/{1.73_m2} (ref >=60)

## 2021-02-08 LAB — CULTURE, BLOOD (SINGLE)
MICRO NUMBER:: 12937697
MICRO NUMBER:: 12937698
Result:: NO GROWTH
Result:: NO GROWTH
SPECIMEN QUALITY:: ADEQUATE
SPECIMEN QUALITY:: ADEQUATE

## 2021-02-08 LAB — HIV ANTIBODY (ROUTINE TESTING W REFLEX): HIV 1&2 Ab, 4th Generation: NONREACTIVE

## 2021-02-08 LAB — RPR: RPR Ser Ql: NONREACTIVE

## 2021-02-25 ENCOUNTER — Ambulatory Visit (INDEPENDENT_AMBULATORY_CARE_PROVIDER_SITE_OTHER): Payer: Self-pay | Admitting: Gastroenterology

## 2021-02-25 ENCOUNTER — Other Ambulatory Visit: Payer: Self-pay | Admitting: Gastroenterology

## 2021-02-25 ENCOUNTER — Encounter: Payer: Self-pay | Admitting: Gastroenterology

## 2021-02-25 VITALS — BP 126/78 | HR 78 | Ht 71.0 in | Wt 191.0 lb

## 2021-02-25 DIAGNOSIS — Z1211 Encounter for screening for malignant neoplasm of colon: Secondary | ICD-10-CM

## 2021-02-25 DIAGNOSIS — B1920 Unspecified viral hepatitis C without hepatic coma: Secondary | ICD-10-CM

## 2021-02-25 DIAGNOSIS — K7469 Other cirrhosis of liver: Secondary | ICD-10-CM

## 2021-02-25 MED ORDER — PLENVU 140 G PO SOLR: ORAL | 0 refills | Status: DC

## 2021-02-25 NOTE — Patient Instructions (Signed)
If you are age 46 or older, your body mass index should be between 23-30. Your Body mass index is 26.64 kg/m. If this is out of the aforementioned range listed, please consider follow up with your Primary Care Provider.  If you are age 86 or younger, your body mass index should be between 19-25. Your Body mass index is 26.64 kg/m. If this is out of the aformentioned range listed, please consider follow up with your Primary Care Provider.   You have been scheduled for an endoscopy and colonoscopy. Please follow the written instructions given to you at your visit today. Please pick up your prep supplies at the pharmacy within the next 1-3 days. If you use inhalers (even only as needed), please bring them with you on the day of your procedure.    The [facility] providers would like to encourage you to use [facility] to communicate with providers for non-urgent requests or questions.  Due to long hold times on the telephone, sending your provider a message by [facility] may be a faster and more efficient way to get a response.  Please allow 48 business hours for a response.  Please remember that this is for non-urgent requests.   It was a pleasure to see you today!  Thank you for trusting me with your gastrointestinal care!    Scott E.Candis Schatz, MD

## 2021-02-25 NOTE — Progress Notes (Signed)
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  Patient reports being diagnosed with hepatitis C last year and was referred to infectious disease for treatment, which he completed on December 28 (Mavyret, 8 weeks).  He had undetectable viral load on December 21. He has a history of remote IV drug use in the past, but does not know when he acquired hepatitis C.  He has not used any illicit drugs in over 3 years.  He stopped drinking alcohol 5 years ago.  His daughter also has hepatitis C cirrhosis, but he denies other family history of liver disease.  He reports following a low-sodium diet, and has been eating a much healthier diet overall in the last year. He denies any chronic GI symptoms other than rare heartburn, for which he takes Pepcid or Tums as needed (usually once or twice a month).  He denies abdominal pain, constipation, diarrhea or blood in the stool.  No dysphagia.  His weight has been stable. No family history of GI cancer. He has never had an upper or lower endoscopy.  An ultrasound/elastography by his infectious disease provider showed evidence of advanced fibrosis (coarsened hepatic echotexture with nodularity, median kPa 9.9 on elastography).  Spleen normal in size.  Normal platelets.  CMP December 21 with AST 24, ALT 32, ALP 67, T. bili 1.0 He denies any history of episodes of jaundice, lower extremity swelling, abdominal swelling, confusion, dark urine or light-colored stools.  Past Medical History:  Diagnosis Date   Cirrhosis ([place])    GERD (gastroesophageal reflux disease)    Hepatitis C    Hydrocele, left    RECURRENT     Past Surgical History:  Procedure Laterality Date   HYDROCELE EXCISION Left 10/18/2015   Procedure: HYDROCELECTOMY ADULT;  Surgeon: Cleon Gustin, MD;  Location: [facility];  Service: Urology;  Laterality: Left;   HYDROCELE EXCISION Left  03/02/2016   Procedure: HYDROCELECTOMY ADULT;  Surgeon: Cleon Gustin, MD;  Location: 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     famotidine (PEPCID) 10 MG tablet Take 10 mg by mouth daily as needed for heartburn or indigestion.     ibuprofen (ADVIL,MOTRIN) 200 MG tablet Take 200 mg by mouth every 6 (six) hours as needed.     No current facility-administered medications for this visit.   No Known Allergies   Review of Systems: All systems reviewed and negative except where noted in HPI.    No  results found.  Physical Exam: BP 126/78    Pulse 78    Ht 5\' 11"  (1.803 m)    Wt 191 lb (86.6 kg)    BMI 26.64 kg/m  Constitutional: Pleasant,well-developed, Caucasian male in no acute distress. HEENT: Normocephalic and atraumatic. Conjunctivae are normal. No scleral icterus.  Mallampati 2 Neck supple.  Cardiovascular: Normal rate, regular rhythm.  Pulmonary/chest: Effort normal and breath sounds normal. No wheezing, rales or rhonchi. Abdominal: Soft, nondistended, nontender. Bowel sounds active throughout. There are no masses palpable. No hepatomegaly. Extremities: no edema Neurological: Alert and oriented to person place and  time. Skin: Skin is warm and dry. No rashes noted. Psychiatric: Normal mood and affect. Behavior is normal.  CBC    Component Value Date/Time   WBC 14.7 (H) 02/03/2021 1204   RBC 5.09 02/03/2021 1204   HGB 16.5 02/03/2021 1204   HCT 47.2 02/03/2021 1204   PLT 309 02/03/2021 1204   MCV 92.7 02/03/2021 1204   MCH 32.4 02/03/2021 1204   MCHC 35.0 02/03/2021 1204   RDW 12.3 02/03/2021 1204   LYMPHSABS 4,454 (H) 02/03/2021 1204   EOSABS 162 02/03/2021 1204   BASOSABS 118 02/03/2021 1204    CMP     Component Value Date/Time   NA 136 02/03/2021 1204   K 4.3 02/03/2021 1204   CL 102 02/03/2021 1204   CO2 28 02/03/2021 1204   GLUCOSE 110 (H) 02/03/2021 1204   BUN 15 02/03/2021 1204   CREATININE 0.80 02/03/2021 1204   CALCIUM 9.9 02/03/2021 1204   PROT 8.3 (H) 02/03/2021 1204   ALBUMIN 4.1 02/09/2017 1144   AST 22 02/03/2021 1204   ALT 29 02/03/2021 1204   ALT 334 (H) 10/07/2020 1039   ALKPHOS 75 02/09/2017 1144   BILITOT 0.7 02/03/2021 1204   GFRNONAA >60 02/09/2017 1144   GFRAA >60 02/09/2017 1144     ASSESSMENT AND PLAN: 46 year old male with compensated hep C cirrhosis, recently completed [place] with undetectable viral load, will be due for SVR assessment in March.  Low suspicion for clinically significant portal hypertension based on normal platelet counts and normal-sized spleen on ultrasound, however upper endoscopy for variceal screening is indicated.  Patient counseled to avoid NSAIDs and to preferentially use Tylenol as needed for headaches of musculoskeletal pain, as long as he is taking less than 3 g/day.  Commended him for abstaining from alcohol and making healthy diet changes.  Patient is also due for his initial average risk screening colonoscopy.  HCV Cirrhosis, compensated - EGD -Repeat ultrasound for HCC screening in April - Follow-up with infectious disease for repeat viral load to confirm SVR  CRC screening - Colonosocpy  The details, risks  (including bleeding, perforation, infection, missed lesions, medication reactions and possible hospitalization or surgery if complications occur), benefits, and alternatives to EGD/colonoscopy with possible biopsy and possible polypectomy were discussed with the patient and he consents to proceed.   Zavon Hyson E. Candis Schatz, MD [facility]   CC:  Synthia Innocent Audrea Muscat, NP

## 2021-03-02 DIAGNOSIS — U071 COVID-19: Secondary | ICD-10-CM

## 2021-03-02 HISTORY — DX: COVID-19: U07.1

## 2021-03-25 ENCOUNTER — Ambulatory Visit (AMBULATORY_SURGERY_CENTER): Payer: Self-pay | Admitting: Gastroenterology

## 2021-03-25 ENCOUNTER — Encounter: Payer: Self-pay | Admitting: Gastroenterology

## 2021-03-25 ENCOUNTER — Other Ambulatory Visit: Payer: Self-pay

## 2021-03-25 VITALS — BP 110/69 | HR 63 | Temp 98.4°F | Resp 12 | Ht 71.0 in | Wt 191.0 lb

## 2021-03-25 DIAGNOSIS — K7469 Other cirrhosis of liver: Secondary | ICD-10-CM

## 2021-03-25 DIAGNOSIS — K3189 Other diseases of stomach and duodenum: Secondary | ICD-10-CM

## 2021-03-25 DIAGNOSIS — B1920 Unspecified viral hepatitis C without hepatic coma: Secondary | ICD-10-CM

## 2021-03-25 DIAGNOSIS — K633 Ulcer of intestine: Secondary | ICD-10-CM

## 2021-03-25 DIAGNOSIS — Z1211 Encounter for screening for malignant neoplasm of colon: Secondary | ICD-10-CM

## 2021-03-25 DIAGNOSIS — K259 Gastric ulcer, unspecified as acute or chronic, without hemorrhage or perforation: Secondary | ICD-10-CM

## 2021-03-25 DIAGNOSIS — K2100 Gastro-esophageal reflux disease with esophagitis, without bleeding: Secondary | ICD-10-CM

## 2021-03-25 MED ORDER — SODIUM CHLORIDE 0.9 % IV SOLN: 500.0000 mL | Freq: Once | INTRAVENOUS | Status: AC

## 2021-03-25 MED ORDER — OMEPRAZOLE 20 MG PO CPDR: 20.0000 mg | DELAYED_RELEASE_CAPSULE | Freq: Two times a day (BID) | ORAL | 1 refills | Status: AC

## 2021-03-25 NOTE — Progress Notes (Signed)
History and Physical Interval Note: ? ?03/25/2021 ?3:08 PM ? ?Justin Newman  has presented today for endoscopic procedure(s), with the diagnosis of  ?Encounter Diagnoses  ?Name Primary?  ? Compensated HCV cirrhosis ([place]) Yes  ? Colon cancer screening   ?Marland Kitchen  The various methods of evaluation and treatment have been discussed with the patient and/or family. After consideration of risks, benefits and other options for treatment, the patient has consented to  the endoscopic procedure(s). ? ? The patient's history has been reviewed, patient examined, no change in status, stable for endoscopic procedure(s).  I have reviewed the patient's chart and labs.  Questions were answered to the patient's satisfaction.   ? ? ?Starlyn Droge E. Candis Schatz, MD ?[facility] ? ?

## 2021-03-25 NOTE — Progress Notes (Signed)
Sedate, gd SR, tolerated procedure well, VSS, report to RN 

## 2021-03-25 NOTE — Progress Notes (Signed)
Called to room to assist during endoscopic procedure.  Patient ID and intended procedure confirmed with present staff. Received instructions for my participation in the procedure from the performing physician.  

## 2021-03-25 NOTE — Op Note (Signed)
[place] ?Patient Name: Justin Newman ?Procedure Date: 03/25/2021 3:06 PM ?MRN: [id number] ?Endoscopist: Sumedh Shinsato E. Candis Schatz , MD ?Age: 46 ?Referring MD:  ?Date of Birth: May 24, 1975 ?Gender: Male ?Account #: [id number] ?Procedure:                Colonoscopy ?Indications:              Screening for colorectal malignant neoplasm, This  ?                          is the patient's first colonoscopy ?Medicines:                Monitored Anesthesia Care ?Procedure:                Pre-Anesthesia Assessment: ?                          - Prior to the procedure, a History and Physical  ?                          was performed, and patient medications and  ?                          allergies were reviewed. The patient's tolerance of  ?                          previous anesthesia was also reviewed. The risks  ?                          and benefits of the procedure and the sedation  ?                          options and risks were discussed with the patient.  ?                          All questions were answered, and informed consent  ?                          was obtained. Prior Anticoagulants: The patient has  ?                          taken no previous anticoagulant or antiplatelet  ?                          agents. ASA Grade Assessment: III - A patient with  ?                          severe systemic disease. After reviewing the risks  ?                          and benefits, the patient was deemed in  ?                          satisfactory condition to undergo the procedure. ?  After obtaining informed consent, the colonoscope  ?                          was passed under direct vision. Throughout the  ?                          procedure, the patient's blood pressure, pulse, and  ?                          oxygen saturations were monitored continuously. The  ?                          CF [id number] #[id number] was introduced through the anus  ?                          and advanced to the  the terminal ileum, with  ?                          identification of the appendiceal orifice and IC  ?                          valve. The colonoscopy was performed without  ?                          difficulty. The patient tolerated the procedure  ?                          well. The quality of the bowel preparation was  ?                          adequate. The terminal ileum, ileocecal valve,  ?                          appendiceal orifice, and rectum were photographed. ?Scope In: 3:26:03 PM ?Scope Out: 3:42:49 PM ?Scope Withdrawal Time: 0 hours 13 minutes 27 seconds  ?Total Procedure Duration: 0 hours 16 minutes 46 seconds  ?Findings:                 The perianal and digital rectal examinations were  ?                          normal. Pertinent negatives include normal  ?                          sphincter tone and no palpable rectal lesions. ?                          A few small-mouthed diverticula were found in the  ?                          sigmoid colon. ?                          The exam was otherwise normal throughout the  ?  examined colon. ?                          The terminal ileum contained multiple two mm  ?                          ulcers. No bleeding was present. No stigmata of  ?                          recent bleeding were seen. Biopsies were taken with  ?                          a cold forceps for histology. Estimated blood loss  ?                          was minimal. ?                          The remainder of the exam in the terminal ileum was  ?                          normal. ?                          The retroflexed view of the distal rectum and anal  ?                          verge was normal and showed no anal or rectal  ?                          abnormalities. ?Complications:            No immediate complications. ?Estimated Blood Loss:     Estimated blood loss was minimal. ?Impression:               - Diverticulosis in the sigmoid colon. ?                           - Multiple ulcers in the terminal ileum. Biopsied.  ?                          Suspect these are NSAID-related. Less suspicious  ?                          for Crohn's disease ?                          - The distal rectum and anal verge are normal on  ?                          retroflexion view. ?Recommendation:           - Patient has a contact number available for  ?                          emergencies. The signs and symptoms of potential  ?  delayed complications were discussed with the  ?                          patient. Return to normal activities tomorrow.  ?                          Written discharge instructions were provided to the  ?                          patient. ?                          - Resume previous diet. ?                          - Continue present medications. ?                          - Await pathology results. ?                          - Repeat colonoscopy in 10 years for screening  ?                          purposes. ?Jasreet Dickie E. Candis Schatz, MD ?03/25/2021 3:56:31 PM ?This report has been signed electronically. ?

## 2021-03-25 NOTE — Op Note (Signed)
[place] ?Patient Name: Justin Newman ?Procedure Date: 03/25/2021 3:07 PM ?MRN: [id number] ?Endoscopist: Oleg Oleson E. Candis Schatz , MD ?Age: 46 ?Referring MD:  ?Date of Birth: 03/07/1975 ?Gender: Male ?Account #: [id number] ?Procedure:                Upper GI endoscopy ?Indications:              Cirrhosis rule out esophageal varices ?Medicines:                Monitored Anesthesia Care ?Procedure:                Pre-Anesthesia Assessment: ?                          - Prior to the procedure, a History and Physical  ?                          was performed, and patient medications and  ?                          allergies were reviewed. The patient's tolerance of  ?                          previous anesthesia was also reviewed. The risks  ?                          and benefits of the procedure and the sedation  ?                          options and risks were discussed with the patient.  ?                          All questions were answered, and informed consent  ?                          was obtained. Prior Anticoagulants: The patient has  ?                          taken no previous anticoagulant or antiplatelet  ?                          agents. ASA Grade Assessment: III - A patient with  ?                          severe systemic disease. After reviewing the risks  ?                          and benefits, the patient was deemed in  ?                          satisfactory condition to undergo the procedure. ?                          After obtaining informed consent, the endoscope was  ?  passed under direct vision. Throughout the  ?                          procedure, the patient's blood pressure, pulse, and  ?                          oxygen saturations were monitored continuously. The  ?                          GIF [id number] #[id number] was introduced through the  ?                          mouth, and advanced to the third part of duodenum.  ?                          The upper GI  endoscopy was accomplished without  ?                          difficulty. The patient tolerated the procedure  ?                          well. ?Scope In: ?Scope Out: ?Findings:                 The examined portions of the nasopharynx,  ?                          oropharynx and larynx were normal. ?                          LA Grade B (one or more mucosal breaks greater than  ?                          5 mm, not extending between the tops of two mucosal  ?                          folds) esophagitis with no bleeding was found. ?                          The exam of the esophagus was otherwise normal. No  ?                          esophageal varices present. ?                          A few localized diminutive erosions with stigmata  ?                          of recent bleeding were found in the prepyloric  ?                          region of the stomach. Biopsies were taken with a  ?  cold forceps for Helicobacter pylori testing.  ?                          Estimated blood loss was minimal. ?                          The exam of the stomach was otherwise normal. ?                          The examined duodenum was normal. ?Complications:            No immediate complications. ?Estimated Blood Loss:     Estimated blood loss was minimal. ?Impression:               - The examined portions of the nasopharynx,  ?                          oropharynx and larynx were normal. ?                          - LA Grade B reflux esophagitis with no bleeding. ?                          - Erosive gastropathy with stigmata of recent  ?                          bleeding. Biopsied. ?                          - Normal examined duodenum. ?Recommendation:           - Patient has a contact number available for  ?                          emergencies. The signs and symptoms of potential  ?                          delayed complications were discussed with the  ?                          patient. Return to normal  activities tomorrow.  ?                          Written discharge instructions were provided to the  ?                          patient. ?                          - Resume previous diet. ?                          - Continue present medications. ?                          - Await pathology results. ?                          -  Use Prilosec (omeprazole) 20 mg PO BID for 8  ?                          weeks. ?                          - Repeat upper endoscopy in 8 weeks to check  ?                          healing. ?Jadrian Bulman E. Candis Schatz, MD ?03/25/2021 3:51:30 PM ?This report has been signed electronically. ?

## 2021-03-25 NOTE — Progress Notes (Signed)
VS-CW  Pt's states no medical or surgical changes since previsit or office visit.  

## 2021-03-25 NOTE — Patient Instructions (Signed)
Please read handouts provided. ?Continue present medications. ?Begin Prilosec ( omeprazole ) 20 mg twice daily for 8 weeks. ?Await pathology results. ?Upper endoscopy May 21, 2021 ? ? ?YOU HAD AN ENDOSCOPIC PROCEDURE TODAY AT [facility]:   Refer to the procedure report that was given to you for any specific questions about what was found during the examination.  If the procedure report does not answer your questions, please call your gastroenterologist to clarify.  If you requested that your care partner not be given the details of your procedure findings, then the procedure report has been included in a sealed envelope for you to review at your convenience later. ? ?YOU SHOULD EXPECT: Some feelings of bloating in the abdomen. Passage of more gas than usual.  Walking can help get rid of the air that was put into your GI tract during the procedure and reduce the bloating. If you had a lower endoscopy (such as a colonoscopy or flexible sigmoidoscopy) you may notice spotting of blood in your stool or on the toilet paper. If you underwent a bowel prep for your procedure, you may not have a normal bowel movement for a few days. ? ?Please Note:  You might notice some irritation and congestion in your nose or some drainage.  This is from the oxygen used during your procedure.  There is no need for concern and it should clear up in a day or so. ? ?SYMPTOMS TO REPORT IMMEDIATELY: ? ?Following lower endoscopy (colonoscopy or flexible sigmoidoscopy): ? Excessive amounts of blood in the stool ? Significant tenderness or worsening of abdominal pains ? Swelling of the abdomen that is new, acute ? Fever of 100?F or higher ? ?Following upper endoscopy (EGD) ? Vomiting of blood or coffee ground material ? New chest pain or pain under the shoulder blades ? Painful or persistently difficult swallowing ? New shortness of breath ? Fever of 100?F or higher ? Black, tarry-looking stools ? ?For urgent or emergent issues,  a gastroenterologist can be reached at any hour by calling [phone]. ?Do not use MyChart messaging for urgent concerns.  ? ? ?DIET:  We do recommend a small meal at first, but then you may proceed to your regular diet.  Drink plenty of fluids but you should avoid alcoholic beverages for 24 hours. ? ?ACTIVITY:  You should plan to take it easy for the rest of today and you should NOT DRIVE or use heavy machinery until tomorrow (because of the sedation medicines used during the test).   ? ?FOLLOW UP: ?Our staff will call the number listed on your records 48-72 hours following your procedure to check on you and address any questions or concerns that you may have regarding the information given to you following your procedure. If we do not reach you, we will leave a message.  We will attempt to reach you two times.  During this call, we will ask if you have developed any symptoms of COVID 19. If you develop any symptoms (ie: fever, flu-like symptoms, shortness of breath, cough etc.) before then, please call [phone].  If you test positive for Covid 19 in the 2 weeks post procedure, please call and report this information to [place].   ? ?If any biopsies were taken you will be contacted by phone or by letter within the next 1-3 weeks.  Please call us at [phone] if you have not heard about the biopsies in 3 weeks.  ? ? ?SIGNATURES/CONFIDENTIALITY: ?You and/or your  care partner have signed paperwork which will be entered into your electronic medical record.  These signatures attest to the fact that that the information above on your After Visit Summary has been reviewed and is understood.  Full responsibility of the confidentiality of this discharge information lies with you and/or your care-partner. answer your questions, please call your gastroenterologist to clarify.  If you requested that your care partner not be given the details of your procedure findings, then the procedure report has been included in a  sealed envelope for you to review at your convenience later. ? ?YOU SHOULD EXPECT: Some feelings of bloating in the abdomen. Passage of more gas than usual.  Walking can help get rid of the air that was put into your GI tract during the procedure and reduce the bloating. If you had a lower endoscopy (such as a colonoscopy or flexible sigmoidoscopy) you may notice spotting of blood in your stool or on the toilet paper. If you underwent a bowel prep for your procedure, you may not have a normal bowel movement for a few days. ? ?Please Note:  You might notice some irritation and congestion in your nose or some drainage.  This is from the oxygen used during your procedure.  There is no need for concern and it should clear up in a day or so. ? ?SYMPTOMS TO REPORT IMMEDIATELY: ? ?Following lower endoscopy (colonoscopy or flexible sigmoidoscopy): ? Excessive amounts of blood in the stool ? Significant tenderness or worsening of abdominal pains ? Swelling of the abdomen that is new, acute ? Fever of 100?F or higher ? ?Following upper endoscopy (EGD) ? Vomiting of blood or coffee ground material ? New chest pain or pain under the shoulder blades ? Painful or persistently difficult swallowing ? New shortness of breath ? Fever of 100?F or higher ? Black, tarry-looking stools ? ?For urgent or emergent issues, a gastroenterologist can be reached at any hour by calling [phone]. ?Do not use MyChart messaging for urgent concerns.  ? ? ?DIET:  We do recommend a small meal at first, but then you may proceed to your regular diet.  Drink plenty of fluids but you should avoid alcoholic beverages for 24 hours. ? ?ACTIVITY:  You should plan to take it easy for the rest of today and you should NOT DRIVE or use heavy machinery until tomorrow (because of the sedation medicines used during the test).   ? ?FOLLOW UP: ?Our staff will call the number listed on your records 48-72 hours following your procedure to check on you and address  any questions or concerns that you may have regarding the information given to you following your procedure. If we do not reach you, we will leave a message.  We will attempt to reach you two times.  During this call, we will ask if you have developed any symptoms of COVID 19. If you develop any symptoms (ie: fever, flu-like symptoms, shortness of breath, cough etc.) before then, please call [phone].  If you test positive for Covid 19 in the 2 weeks post procedure, please call and report this information to [place].   ? ?If any biopsies were taken you will be contacted by phone or by letter within the next 1-3 weeks.  Please call us at [phone] if you have not heard about the biopsies in 3 weeks.  ? ? ?SIGNATURES/CONFIDENTIALITY: ?You and/or your care partner have signed paperwork which will be entered into your electronic medical record.  These  signatures attest to the fact that that the information above on your After Visit Summary has been reviewed and is understood.  Full responsibility of the confidentiality of this discharge information lies with you and/or your care-partner.  ?

## 2021-03-26 ENCOUNTER — Ambulatory Visit (INDEPENDENT_AMBULATORY_CARE_PROVIDER_SITE_OTHER): Payer: Self-pay | Admitting: Internal Medicine

## 2021-03-26 ENCOUNTER — Encounter: Payer: Self-pay | Admitting: Internal Medicine

## 2021-03-26 ENCOUNTER — Other Ambulatory Visit: Payer: Self-pay

## 2021-03-26 VITALS — BP 172/90 | HR 54 | Temp 98.1°F | Ht 71.0 in | Wt 194.0 lb

## 2021-03-26 DIAGNOSIS — Z23 Encounter for immunization: Secondary | ICD-10-CM

## 2021-03-26 DIAGNOSIS — B1920 Unspecified viral hepatitis C without hepatic coma: Secondary | ICD-10-CM

## 2021-03-26 NOTE — Addendum Note (Signed)
Addended byLaurice Record on: 03/26/2021 12:11 PM ? ? Modules accepted: Orders ? ?

## 2021-03-26 NOTE — Addendum Note (Signed)
Addended by: Lucie Leather D on: 03/26/2021 11:37 AM ? ? Modules accepted: Orders ? ?

## 2021-03-26 NOTE — Progress Notes (Signed)
? ?   ? ?Patient's Medications  ?New Prescriptions  ? No medications on file  ?Previous Medications  ? CALCIUM CARBONATE (TUMS - DOSED IN MG ELEMENTAL CALCIUM) 500 MG CHEWABLE TABLET    Chew 1 tablet by mouth as needed for indigestion or heartburn.  ? FAMOTIDINE (PEPCID) 10 MG TABLET    Take 10 mg by mouth daily as needed for heartburn or indigestion.  ? IBUPROFEN (ADVIL,MOTRIN) 200 MG TABLET    Take 200 mg by mouth every 6 (six) hours as needed.  ? OMEPRAZOLE (PRILOSEC) 20 MG CAPSULE    Take 1 capsule (20 mg total) by mouth 2 (two) times daily.  ?Modified Medications  ? No medications on file  ?Discontinued Medications  ? No medications on file  ? ? ?Subjective: ?Justin Newman is a 46 y.o.old male with chronic HCV presents for HCV follow-up for test of cure. Completed mavyret on 01/01/22. Reports 100% adherence to mavyret. He denies any abdominal pain, N,V,D.  ?EGD on 03/25/21 showed esophagitis, Bx taken of gastric mucosa for H pylori. No varices noted.  ? ?Review of Systems: ?Review of Systems  ?All other systems reviewed and are negative. ? ?Past Medical History:  ?Diagnosis Date  ? Cirrhosis ([place])   ? COVID-19 03/02/2021  ? GERD (gastroesophageal reflux disease)   ? Hepatitis C   ? Hydrocele, left   ? RECURRENT  ? ? ?Social History  ? ?Tobacco Use  ? Smoking status: Former  ?  Packs/day: 0.50  ?  Years: 30.00  ?  Pack years: 15.00  ?  Types: Cigarettes  ?  Quit date: 03/04/2021  ?  Years since quitting: 0.0  ? Smokeless tobacco: Former  ?  Types: Snuff  ?Vaping Use  ? Vaping Use: Never used  ?Substance Use Topics  ? Alcohol use: No  ?  Comment: no alcohol 2.5 years 10/06/20  ? 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 ? ?Family History  ?Problem Relation Age of Onset  ? Diabetes Father   ? Heart disease Father   ? Diabetes Paternal Uncle   ? ? ?No Known Allergies ? ?Health Maintenance  ?Topic Date Due  ? COVID-19 Vaccine (5 - Booster) 03/09/2020  ? TETANUS/TDAP  05/23/2025  ? COLONOSCOPY (Pts 45-15yr  Insurance coverage will need to be confirmed)  03/26/2031  ? INFLUENZA VACCINE  Completed  ? Hepatitis C Screening  Completed  ? HIV Screening  Completed  ? HPV VACCINES  Aged Out  ? ? ?Objective: ? ?There were no vitals filed for this visit. ?There is no height or weight on file to calculate BMI. ? ?Physical Exam ?Constitutional:   ?   General: He is not in acute distress. ?   Appearance: He is normal weight. He is not toxic-appearing.  ?HENT:  ?   Head: Normocephalic and atraumatic.  ?   Right Ear: External ear normal.  ?   Left Ear: External ear normal.  ?   Nose: No congestion or rhinorrhea.  ?   Mouth/Throat:  ?   Mouth: Mucous membranes are moist.  ?   Pharynx: Oropharynx is clear.  ?Eyes:  ?   Extraocular Movements: Extraocular movements intact.  ?   Conjunctiva/sclera: Conjunctivae normal.  ?   Pupils: Pupils are equal, round, and reactive to light.  ?Cardiovascular:  ?   Rate and Rhythm: Normal rate and regular rhythm.  ?   Heart sounds: No murmur heard. ?  No friction rub.  No gallop.  ?Pulmonary:  ?   Effort: Pulmonary effort is normal.  ?   Breath sounds: Normal breath sounds.  ?Abdominal:  ?   General: Abdomen is flat. Bowel sounds are normal.  ?   Palpations: Abdomen is soft.  ?Musculoskeletal:     ?   General: No swelling. Normal range of motion.  ?   Cervical back: Normal range of motion and neck supple.  ?Skin: ?   General: Skin is warm and dry.  ?Neurological:  ?   General: No focal deficit present.  ?   Mental Status: He is oriented to person, place, and time.  ?Psychiatric:     ?   Mood and Affect: Mood normal.  ? ? ?Lab Results ?Lab Results  ?Component Value Date  ? WBC 14.7 (H) 02/03/2021  ? HGB 16.5 02/03/2021  ? HCT 47.2 02/03/2021  ? MCV 92.7 02/03/2021  ? PLT 309 02/03/2021  ?  ?Lab Results  ?Component Value Date  ? CREATININE 0.80 02/03/2021  ? BUN 15 02/03/2021  ? NA 136 02/03/2021  ? K 4.3 02/03/2021  ? CL 102 02/03/2021  ? CO2 28 02/03/2021  ?  ?Lab Results  ?Component Value Date  ? ALT  29 02/03/2021  ? AST 22 02/03/2021  ? GGT 86 10/07/2020  ? ALKPHOS 75 02/09/2017  ? BILITOT 0.7 02/03/2021  ?  ?No results found for: CHOL, HDL, LDLCALC, LDLDIRECT, TRIG, CHOLHDL ?Lab Results  ?Component Value Date  ? LABRPR NON-REACTIVE 02/03/2021  ? ?No results found for: HIV1RNAQUANT, HIV1RNAVL, CD4TABS ?  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 Hep B 2nd dose today.  ?- HCV screening with [place] every 6 months(will do at next visit). Follow-up in 3 months for VL check.  ? ? ?Laurice Record, MD ?[facility] ?[facility] ?03/26/2021, 11:15 AM  ?

## 2021-03-27 ENCOUNTER — Ambulatory Visit: Payer: Self-pay | Admitting: Internal Medicine

## 2021-03-27 ENCOUNTER — Telehealth: Payer: Self-pay

## 2021-03-27 NOTE — Telephone Encounter (Signed)
?  Follow up Call- ? ? ?  03/25/2021  ?  2:54 PM  ?Call back number  ?Post procedure Call Back phone  # [phone]  ?Permission to leave phone message Yes  ?  ? ?Patient questions: ? ?Do you have a fever, pain , or abdominal swelling? No. ?Pain Score  0 * ? ?Have you tolerated food without any problems? Yes.   ? ?Have you been able to return to your normal activities? Yes.   ? ?Do you have any questions about your discharge instructions: ?Diet   No. ?Medications  No. ?Follow up visit  No. ? ?Do you have questions or concerns about your Care? No. ? ?Actions: ?* If pain score is 4 or above: ?No action needed, pain <4. ? ? ?

## 2021-03-29 LAB — CBC WITH DIFFERENTIAL/PLATELET
Absolute Monocytes: 533 cells/uL (ref 200–950)
Basophils Absolute: 69 cells/uL (ref 0–200)
Basophils Relative: 0.8 %
Eosinophils Absolute: 103 cells/uL (ref 15–500)
Eosinophils Relative: 1.2 %
HCT: 44.5 % (ref 38.5–50.0)
Hemoglobin: 15.1 g/dL (ref 13.2–17.1)
Lymphs Abs: 3638 cells/uL (ref 850–3900)
MCH: 31.5 pg (ref 27.0–33.0)
MCHC: 33.9 g/dL (ref 32.0–36.0)
MCV: 92.9 fL (ref 80.0–100.0)
MPV: 10 fL (ref 7.5–12.5)
Monocytes Relative: 6.2 %
Neutro Abs: 4257 cells/uL (ref 1500–7800)
Neutrophils Relative %: 49.5 %
Platelets: 303 10*3/uL (ref 140–400)
RBC: 4.79 10*6/uL (ref 4.20–5.80)
RDW: 12.6 % (ref 11.0–15.0)
Total Lymphocyte: 42.3 %
WBC: 8.6 10*3/uL (ref 3.8–10.8)

## 2021-03-29 LAB — HEPATITIS C RNA QUANTITATIVE
HCV Quantitative Log: <1.18 log IU/mL
HCV RNA, PCR, QN: <15 IU/mL

## 2021-03-31 NOTE — Progress Notes (Signed)
Justin Newman,  ?The biopsies taken from your stomach were notable for mild reactive gastropathy which is a common finding and often related to use of certain medications (usually NSAIDs), but there was no evidence of Helicobacter pylori infection. This common finding is not felt to necessarily be a cause of any particular symptom and there is no specific treatment or further evaluation recommended. ?The biopsies of your terminal ileum were normal.  There was no evidence of Crohn's disease.  The small ulcers seen were also likely related to NSAIDs. ? ?Please take the omeprazole twice daily as discussed and repeat EGD in 8 weeks to assess healing of esophagitis. ? ?Please avoid NSAIDs and use Tylenol (acetaminophen) as needed for headaches/musculoskeletal pain. ? ?You will be due for screening colonoscopy again in 10 years.

## 2021-05-19 ENCOUNTER — Telehealth: Payer: Self-pay | Admitting: Gastroenterology

## 2021-05-19 NOTE — Telephone Encounter (Signed)
We received a call from patient wanting to cancel his EGD scheduled 05/21/21. Per patient, would like to pay off his bad debt first before having procedure. Will call to reschedule.  ?

## 2021-05-21 ENCOUNTER — Encounter: Payer: Self-pay | Admitting: Gastroenterology

## 2021-06-18 ENCOUNTER — Ambulatory Visit (INDEPENDENT_AMBULATORY_CARE_PROVIDER_SITE_OTHER): Payer: Self-pay | Admitting: Internal Medicine

## 2021-06-18 ENCOUNTER — Other Ambulatory Visit: Payer: Self-pay

## 2021-06-18 ENCOUNTER — Encounter: Payer: Self-pay | Admitting: Internal Medicine

## 2021-06-18 VITALS — BP 128/86 | HR 74 | Temp 97.9°F | Wt 196.0 lb

## 2021-06-18 DIAGNOSIS — B182 Chronic viral hepatitis C: Secondary | ICD-10-CM

## 2021-06-18 DIAGNOSIS — Z23 Encounter for immunization: Secondary | ICD-10-CM

## 2021-06-18 NOTE — Progress Notes (Signed)
[facility]                                      [address], [place], [place], [address]                                               Phn. [phone]; Fax: [phone]                                                               Date:  Reason for Visit: Hepatitis C    HPI: Justin Newman is a 46 y.o.old male Justin Newman is a 46 y.o.old male with chronic HCV presents for HCV follow-up for test of cure. Completed mavyret on 01/01/22. Reports 100% adherence to mavyret. He denies any abdominal pain, N,V,D.  EGD on 03/25/21 showed esophagitis, Bx taken of gastric mucosa for H pylori. No varices noted.      Current Outpatient Medications on File Prior to Visit  Medication Sig Dispense Refill   calcium carbonate (TUMS - DOSED IN MG ELEMENTAL CALCIUM) 500 MG chewable tablet Chew 1 tablet by mouth as needed for indigestion or heartburn.     ibuprofen (ADVIL,MOTRIN) 200 MG tablet Take 200 mg by mouth every 6 (six) hours as needed.     omeprazole (PRILOSEC) 20 MG capsule Take 1 capsule (20 mg total) by mouth 2 (two) times daily. 60 capsule 1   famotidine (PEPCID) 10 MG tablet Take 10 mg by mouth daily as needed for heartburn or indigestion. (Patient not taking: Reported on 03/26/2021)     Current Facility-Administered Medications on File Prior to Visit  Medication Dose Route Frequency Provider Last Rate Last Admin   0.9 %  sodium chloride infusion  500 mL Intravenous Once Daryel November, MD       No Known Allergies  Past Medical History:  Diagnosis Date   Cirrhosis ([place])    COVID-19 03/02/2021   GERD (gastroesophageal reflux disease)    Hepatitis C    Hydrocele, left    RECURRENT    Past Surgical History:  Procedure Laterality Date   HYDROCELE EXCISION Left 10/18/2015   Procedure: HYDROCELECTOMY ADULT;  Surgeon: Cleon Gustin, MD;  Location: [facility];  Service: Urology;  Laterality: Left;   HYDROCELE  EXCISION Left 03/02/2016   Procedure: HYDROCELECTOMY ADULT;  Surgeon: Cleon Gustin, MD;  [facility];  Service: Urology;  Laterality: Left;   INGUINAL HERNIA REPAIR Right 1995  aprrox    Social History   Socioeconomic History   Marital status: Married    Spouse name: Not on file   Number of children: Not on file   Years of education: Not on file   Highest education level: Not on file  Occupational History   Not on file  Tobacco Use   Smoking status: Former    Packs/day: 0.50    Years: 30.00    Total pack years: 15.00    Types: Cigarettes    Quit date: 03/04/2021    Years since quitting: 0.2  Smokeless tobacco: Former    Types: Snuff  Vaping Use   Vaping Use: Never used  Substance and Sexual Activity   Alcohol use: No    Comment: no alcohol 2.5 years 10/06/20   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   Sexual activity: Yes    Partners: Female  Other Topics Concern   Not on file  Social History Narrative   ** Merged History Encounter **       Social Determinants of Health   Financial Resource Strain: Not on file  Food Insecurity: Not on file  Transportation Needs: Not on file  Physical Activity: Not on file  Stress: Not on file  Social Connections: Not on file  Intimate Partner Violence: Not on file    Family History  Problem Relation Age of Onset   Diabetes Father    Heart disease Father    Diabetes Paternal Uncle     Physical exam: BP 128/86   Pulse 74   Temp 97.9 F (36.6 C) (Oral)   Wt 196 lb (88.9 kg)   BMI 27.34 kg/m   Gen: Alert and oriented x 3, no acute distress HEENT: [name]/AT, PERL, EOMI, no scleral icterus, no pale conjunctivae, hearing normal, oral mucosa moist Neck: Supple, no lymphadenopathy Cardio: Regular rate and rhythm; +S1 and S2; no murmurs, gallops, or rubs Resp: CTAB; no wheezes, rhonchi, or rales GI: Soft, nontender, nondistended, bowel sounds present GU: Musc: Extremities: No cyanosis,  clubbing, or edema; +2 PT and DP pulses Skin: No rashes, lesions, or ecchymoses Neuro: No focal deficits Psych: Calm, cooperative   Laboratory  CBC w diff CMP PT/PTT/INR Hep A and B serologies  HIV HCV genotype HCV RNA Measure of fibrosis NS5A resistance testing in select scenarios    Assessment/Plan: #HCV infection-chronic -Stage F4, U/S consistent with cirrhosis -Genotype 3 -EGD on 03/25/21 showed no varices. Plan on repeat EGD in 8 weeks after taking omeprazole twice daily. Pt has not gotten in done yet due parment reasons. He plans to get it in August, 2023 -Completed 8 weeks of Mavyret on 01/01/21, SVR on 3/22 Plan: -Labs today: cbc, cmp HCV RNA - Needs 2nd Hep A dose today.  - HCV screening with [place] every 6 months(today)  -Follow up with GI for repeat EGD -Follow up in 6 months for repeat imaging     Electronically signed by:  Laurice Record, MD Infectious Diseases  Fax no. [phone]

## 2021-06-22 LAB — COMPLETE METABOLIC PANEL WITH GFR
AG Ratio: 1.3 (calc) (ref 1.0–2.5)
ALT: 26 U/L (ref 9–46)
AST: 24 U/L (ref 10–40)
Albumin: 4.3 g/dL (ref 3.6–5.1)
Alkaline phosphatase (APISO): 69 U/L (ref 36–130)
BUN: 20 mg/dL (ref 7–25)
CO2: 26 mmol/L (ref 20–32)
Calcium: 9.7 mg/dL (ref 8.6–10.3)
Chloride: 105 mmol/L (ref 98–110)
Creat: 1.02 mg/dL (ref 0.60–1.29)
Globulin: 3.2 g/dL (calc) (ref 1.9–3.7)
Glucose, Bld: 89 mg/dL (ref 65–99)
Potassium: 4 mmol/L (ref 3.5–5.3)
Sodium: 139 mmol/L (ref 135–146)
Total Bilirubin: 0.8 mg/dL (ref 0.2–1.2)
Total Protein: 7.5 g/dL (ref 6.1–8.1)
eGFR: 92 mL/min/{1.73_m2} (ref >=60)

## 2021-06-22 LAB — CBC WITH DIFFERENTIAL/PLATELET
Absolute Monocytes: 524 cells/uL (ref 200–950)
Basophils Absolute: 107 cells/uL (ref 0–200)
Basophils Relative: 1 %
Eosinophils Absolute: 128 cells/uL (ref 15–500)
Eosinophils Relative: 1.2 %
HCT: 44.8 % (ref 38.5–50.0)
Hemoglobin: 15.3 g/dL (ref 13.2–17.1)
Lymphs Abs: 4836 cells/uL — ABNORMAL HIGH (ref 850–3900)
MCH: 31.4 pg (ref 27.0–33.0)
MCHC: 34.2 g/dL (ref 32.0–36.0)
MCV: 92 fL (ref 80.0–100.0)
MPV: 9.7 fL (ref 7.5–12.5)
Monocytes Relative: 4.9 %
Neutro Abs: 5104 cells/uL (ref 1500–7800)
Neutrophils Relative %: 47.7 %
Platelets: 315 10*3/uL (ref 140–400)
RBC: 4.87 10*6/uL (ref 4.20–5.80)
RDW: 12.9 % (ref 11.0–15.0)
Total Lymphocyte: 45.2 %
WBC: 10.7 10*3/uL (ref 3.8–10.8)

## 2021-06-22 LAB — HEPATITIS C RNA QUANTITATIVE
HCV Quantitative Log: <1.18 log IU/mL
HCV RNA, PCR, QN: <15 IU/mL

## 2021-06-27 ENCOUNTER — Ambulatory Visit (HOSPITAL_COMMUNITY)
Admission: RE | Admit: 2021-06-27 | Discharge: 2021-06-27 | Disposition: A | Discharge status: Home / Self Care | Payer: Self-pay | Source: Ambulatory Visit | Attending: Internal Medicine | Admitting: Internal Medicine

## 2021-06-27 DIAGNOSIS — B182 Chronic viral hepatitis C: Secondary | ICD-10-CM | POA: Insufficient documentation

## 2021-06-30 ENCOUNTER — Encounter: Payer: Self-pay | Admitting: Gastroenterology

## 2021-08-27 ENCOUNTER — Ambulatory Visit: Payer: Self-pay | Admitting: Dermatology

## 2021-09-16 ENCOUNTER — Encounter: Payer: Self-pay | Admitting: Internal Medicine

## 2021-09-17 ENCOUNTER — Other Ambulatory Visit (HOSPITAL_COMMUNITY): Payer: Self-pay

## 2021-09-17 ENCOUNTER — Ambulatory Visit (INDEPENDENT_AMBULATORY_CARE_PROVIDER_SITE_OTHER): Payer: Self-pay | Admitting: Family

## 2021-09-17 ENCOUNTER — Encounter: Payer: Self-pay | Admitting: Family

## 2021-09-17 ENCOUNTER — Other Ambulatory Visit: Payer: Self-pay

## 2021-09-17 VITALS — BP 144/86 | HR 71 | Temp 98.6°F | Ht 71.0 in | Wt 191.0 lb

## 2021-09-17 DIAGNOSIS — L282 Other prurigo: Secondary | ICD-10-CM

## 2021-09-17 MED ORDER — TRIAMCINOLONE ACETONIDE 0.1 % EX CREA: 1.0000 | TOPICAL_CREAM | Freq: Two times a day (BID) | CUTANEOUS | 0 refills | Status: DC

## 2021-09-17 NOTE — Progress Notes (Signed)
Subjective:    Patient ID: Justin Newman, male    DOB: 1975/07/07, 46 y.o.   MRN: [id number]  Chief Complaint  Patient presents with   Rash    HPI:  Justin Newman is a 46 y.o. male with chronic hepatitis C s/p treatment and last seen by Dr. Candiss Norse on 06/18/21 for [facility] visit presents today for an acute office visit.   Justin Newman has had a rash located primarily on his hands and feet that is described as itchy and develops into small vesicles that drain clear fluid that has been going on since around 2018. A referral to Dermatology was placed by Dr. Candiss Norse however the practice he was referred to has since closed and awaiting another appointment. Symptoms generally wax and wane. Upon research he is concerned that this may be syphilis infection. He works in [employer] and has attempted to use hand cream to help with his symptoms as well as antifungal cream which has not helped with his symptoms.   No Known Allergies    Outpatient Medications Prior to Visit  Medication Sig Dispense Refill   calcium carbonate (TUMS - DOSED IN MG ELEMENTAL CALCIUM) 500 MG chewable tablet Chew 1 tablet by mouth as needed for indigestion or heartburn. (Patient not taking: Reported on 09/17/2021)     famotidine (PEPCID) 10 MG tablet Take 10 mg by mouth daily as needed for heartburn or indigestion. (Patient not taking: Reported on 03/26/2021)     ibuprofen (ADVIL,MOTRIN) 200 MG tablet Take 200 mg by mouth every 6 (six) hours as needed. (Patient not taking: Reported on 09/17/2021)     omeprazole (PRILOSEC) 20 MG capsule Take 1 capsule (20 mg total) by mouth 2 (two) times daily. (Patient not taking: Reported on 09/17/2021) 60 capsule 1   Facility-Administered Medications Prior to Visit  Medication Dose Route Frequency Provider Last Rate Last Admin   0.9 %  sodium chloride infusion  500 mL Intravenous Once Daryel November, MD         Past Medical History:  Diagnosis Date   Cirrhosis ([place])    COVID-19 03/02/2021    GERD (gastroesophageal reflux disease)    Hepatitis C    Hydrocele, left    RECURRENT     Past Surgical History:  Procedure Laterality Date   HYDROCELE EXCISION Left 10/18/2015   Procedure: HYDROCELECTOMY ADULT;  Surgeon: Cleon Gustin, MD;  Location: [facility];  Service: Urology;  Laterality: Left;   HYDROCELE EXCISION Left 03/02/2016   Procedure: HYDROCELECTOMY ADULT;  Surgeon: Cleon Gustin, MD;  Location[facility];  Service: Urology;  Laterality: Left;   INGUINAL HERNIA REPAIR Right 1995  aprrox       Review of Systems  Constitutional:  Negative for chills, diaphoresis, fatigue and fever.  Respiratory:  Negative for cough, chest tightness, shortness of breath and wheezing.   Cardiovascular:  Negative for chest pain.  Gastrointestinal:  Negative for abdominal distention, abdominal pain, constipation, diarrhea, nausea and vomiting.  Skin:  Positive for rash.  Neurological:  Negative for weakness and headaches.  Hematological:  Does not bruise/bleed easily.      Objective:    BP (!) 144/86   Pulse 71   Temp 98.6 F (37 C) (Temporal)   Ht '5\' 11"'$  (1.803 m)   Wt 191 lb (86.6 kg)   BMI 26.64 kg/m  Nursing note and vital signs reviewed.  Physical Exam Constitutional:      General: He is not in acute distress.  Appearance: He is well-developed.  Cardiovascular:     Rate and Rhythm: Normal rate and regular rhythm.     Heart sounds: Normal heart sounds. No murmur heard.    No friction rub. No gallop.  Pulmonary:     Effort: Pulmonary effort is normal. No respiratory distress.     Breath sounds: Normal breath sounds. No wheezing or rales.  Chest:     Chest wall: No tenderness.  Abdominal:     General: Bowel sounds are normal. There is no distension.     Palpations: Abdomen is soft. There is no mass.     Tenderness: There is no abdominal tenderness. There is no guarding or rebound.  Skin:    General: Skin is warm and dry.     Findings:  Rash (Located on palms and soles; dry/scaling with healing vesicles.) present.  Neurological:     Mental Status: He is alert and oriented to person, place, and time.  Psychiatric:        Behavior: Behavior normal.        Thought Content: Thought content normal.        Judgment: Judgment normal.         09/17/2021   10:44 AM 03/26/2021   11:20 AM 02/03/2021   11:10 AM 11/27/2020    1:44 PM 10/07/2020    9:47 AM  Depression screen PHQ 2/9  Decreased Interest 0 0 0 0 0  Down, Depressed, Hopeless 0 0 0 0 1  PHQ - 2 Score 0 0 0 0 1       Assessment & Plan:    Patient Active Problem List   Diagnosis Date Noted   Pruritic rash 09/17/2021     Problem List Items Addressed This Visit       Musculoskeletal and Integument   Pruritic rash - Primary    Justin Newman has a chronic puritic rash on his palms and soles that has been refractory to antifungals and hand cream. Two previous RPR tests were negative. Suspect this is likely related to liver cirrhosis. Agree with referral to Dermatology when able. Will try steroid cream to see if it helps with itching.         I am having Justin Newman maintain his ibuprofen, calcium carbonate, famotidine, omeprazole, and triamcinolone cream. We will continue to administer sodium chloride.   Meds ordered this encounter  Medications   DISCONTD: triamcinolone cream (KENALOG) 0.1 %    Sig: Apply 1 Application topically 2 (two) times daily.    Dispense:  15 g    Refill:  0    Order Specific Question:   Supervising Provider    Answer:   Carlyle Basques [[id number]]   triamcinolone cream (KENALOG) 0.1 %    Sig: Apply 1 Application topically 2 (two) times daily.    Dispense:  30 g    Refill:  0    Order Specific Question:   Supervising Provider    Answer:   Carlyle Basques [[id number]]     Follow-up: Recommend follow up with Dermatology and establish with Internal Medicine. Follow up ID as needed.    Justin Piedra, MSN, FNP-C Nurse  Practitioner [facility] for [facility] [place] number: [phone]

## 2021-09-17 NOTE — Patient Instructions (Signed)
Nice to see you.  Try the steroid cream for a few days and recommend following up with dermatology.   Have a great day and stay safe!

## 2021-09-17 NOTE — Assessment & Plan Note (Signed)
[name] has a chronic puritic rash on his palms and soles that has been refractory to antifungals and hand cream. Two previous RPR tests were negative. Suspect this is likely related to liver cirrhosis. Agree with referral to Dermatology when able. Will try steroid cream to see if it helps with itching.

## 2021-12-17 ENCOUNTER — Other Ambulatory Visit: Payer: Self-pay

## 2021-12-17 ENCOUNTER — Encounter: Payer: Self-pay | Admitting: Internal Medicine

## 2021-12-17 ENCOUNTER — Ambulatory Visit (INDEPENDENT_AMBULATORY_CARE_PROVIDER_SITE_OTHER): Payer: Self-pay | Admitting: Internal Medicine

## 2021-12-17 VITALS — BP 152/84 | HR 70 | Temp 97.4°F | Ht 71.0 in | Wt 195.0 lb

## 2021-12-17 DIAGNOSIS — B1920 Unspecified viral hepatitis C without hepatic coma: Secondary | ICD-10-CM

## 2021-12-17 DIAGNOSIS — Z23 Encounter for immunization: Secondary | ICD-10-CM

## 2021-12-17 NOTE — Progress Notes (Signed)
Patient Active Problem List   Diagnosis Date Noted   Pruritic rash 09/17/2021    Patient's Medications  New Prescriptions   No medications on file  Previous Medications   CALCIUM CARBONATE (TUMS - DOSED IN MG ELEMENTAL CALCIUM) 500 MG CHEWABLE TABLET    Chew 1 tablet by mouth as needed for indigestion or heartburn.   FAMOTIDINE (PEPCID) 10 MG TABLET    Take 10 mg by mouth daily as needed for heartburn or indigestion.   IBUPROFEN (ADVIL,MOTRIN) 200 MG TABLET    Take 200 mg by mouth every 6 (six) hours as needed.   OMEPRAZOLE (PRILOSEC) 20 MG CAPSULE    Take 1 capsule (20 mg total) by mouth 2 (two) times daily.   TRIAMCINOLONE CREAM (KENALOG) 0.1 %    Apply 1 Application topically 2 (two) times daily.  Modified Medications   No medications on file  Discontinued Medications   No medications on file    Subjective: Justin Newman is a 46 y.o.old male Justin Newman is a 46 y.o.old male with chronic HCV presents for HCV follow-up for test of cure. Completed mavyret on 01/01/22.  EGD on 03/25/21 showed esophagitis, Bx taken of gastric mucosa for H pylori. No varices noted.   Today 12/17/21: He reports that rash on palms is about the same, steroid cream did not help. His appt with Derm was canceled and did not reschedule.   Review of Systems: Review of Systems  All other systems reviewed and are negative.   Past Medical History:  Diagnosis Date   Cirrhosis ([place])    COVID-19 03/02/2021   GERD (gastroesophageal reflux disease)    Hepatitis C    Hydrocele, left    RECURRENT    Social History   Tobacco Use   Smoking status: Every Day    Packs/day: 0.50    Years: 30.00    Total pack years: 15.00    Types: Cigarettes    Last attempt to quit: 03/04/2021    Years since quitting: 0.7   Smokeless tobacco: Former    Types: Snuff   Tobacco comments:    Working on cutting back; started smoking again  Vaping Use   Vaping Use: Never used  Substance Use Topics   Alcohol  use: No    Comment: no alcohol 2.5 years 10/06/20   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    Family History  Problem Relation Age of Onset   Diabetes Father    Heart disease Father    Diabetes Paternal Uncle     No Known Allergies  Health Maintenance  Topic Date Due   INFLUENZA VACCINE  08/05/2021   COVID-19 Vaccine (5 - 2023-24 season) 09/05/2021   DTaP/Tdap/Td (2 - Td or Tdap) 05/23/2025   COLONOSCOPY (Pts 45-62yr Insurance coverage will need to be confirmed)  03/26/2031   Hepatitis C Screening  Completed   HIV Screening  Completed   HPV VACCINES  Aged Out    Objective:  Vitals:   12/17/21 0838  Weight: 195 lb (88.5 kg)  Height: '5\' 11"'$  (1.803 m)   Body mass index is 27.2 kg/m.  Physical Exam Constitutional:      General: He is not in acute distress.    Appearance: He is normal weight. He is not toxic-appearing.  HENT:     Head: Normocephalic and atraumatic.     Right Ear: External ear normal.     Left Ear:  External ear normal.     Nose: No congestion or rhinorrhea.     Mouth/Throat:     Mouth: Mucous membranes are moist.     Pharynx: Oropharynx is clear.  Eyes:     Extraocular Movements: Extraocular movements intact.     Conjunctiva/sclera: Conjunctivae normal.     Pupils: Pupils are equal, round, and reactive to light.  Cardiovascular:     Rate and Rhythm: Normal rate and regular rhythm.     Heart sounds: No murmur heard.    No friction rub. No gallop.  Pulmonary:     Effort: Pulmonary effort is normal.     Breath sounds: Normal breath sounds.  Abdominal:     General: Abdomen is flat. Bowel sounds are normal.     Palpations: Abdomen is soft.  Musculoskeletal:        General: No swelling. Normal range of motion.     Cervical back: Normal range of motion and neck supple.  Skin:    General: Skin is warm and dry.     Comments: Left palmar rash  Neurological:     General: No focal deficit present.     Mental Status: He is oriented to  person, place, and time.  Psychiatric:        Mood and Affect: Mood normal.     Lab Results Lab Results  Component Value Date   WBC 10.7 06/18/2021   HGB 15.3 06/18/2021   HCT 44.8 06/18/2021   MCV 92.0 06/18/2021   PLT 315 06/18/2021    Lab Results  Component Value Date   CREATININE 1.02 06/18/2021   BUN 20 06/18/2021   NA 139 06/18/2021   K 4.0 06/18/2021   CL 105 06/18/2021   CO2 26 06/18/2021    Lab Results  Component Value Date   ALT 26 06/18/2021   AST 24 06/18/2021   GGT 86 10/07/2020   ALKPHOS 75 02/09/2017   BILITOT 0.8 06/18/2021    No results found for: "CHOL", "HDL", "LDLCALC", "LDLDIRECT", "TRIG", "CHOLHDL" Lab Results  Component Value Date   LABRPR NON-REACTIVE 02/03/2021   No results found for: "HIV1RNAQUANT", "HIV1RNAVL", "CD4TABS"   Problem List Items Addressed This Visit   None  Assessment/Plan: #HCV infection-chronic -Stage F4, U/S consistent with cirrhosis. Genotype 3. Completed 8 weeks of Mavyret on 01/01/21, SVR on 3/22 -EGD on 03/25/21 showed no varices. Plan on repeat EGD in 8 weeks after taking omeprazole twice daily. Pt has not gotten in done yet due parment reasons. He plans to get it in August, 2023 - Last [place] on 6/23 showed probable hepatic hemangioma 8m greatest diameter, confirm with MR imaging. Mildly nodular hepatic margin wihtout mass. Will get MRI. Pt is has no abdominal pain/nausea/weight loss.  Plan: -Labs today: cbc, cmp HCV RNA - Hep A series complete - MRI ordered - Follow up with GI for repeat EGD(plan to discuss with pt at next visit) -F/U with MRI results on 01/08/21  #Plam rash-chronic -Pt plans to F/U with Derm. Rash could be cutaneous stigmata of cirrhosis.      I have personally spent 45 minutes involved in face-to-face and non-face-to-face activities for this patient on the day of the visit. Professional time spent includes the following activities: Preparing to see the patient (review of tests), Obtaining and/or  reviewing separately obtained history (admission/discharge record), Performing a medically appropriate examination and/or evaluation , Ordering medications/tests/procedures, referring and communicating with other health care professionals, Documenting clinical information in the EMR, Independently interpreting results (  not separately reported), Communicating results to the patient/family/caregiver, Counseling and educating the patient/family/caregiver and Care coordination (not separately reported).    Laurice Record, MD [place] [facility] Infectious [facility] 12/17/2021, 8:42 AM

## 2021-12-20 LAB — CBC WITH DIFFERENTIAL/PLATELET
Absolute Monocytes: 753 cells/uL (ref 200–950)
Basophils Absolute: 106 cells/uL (ref 0–200)
Basophils Relative: 1 %
Eosinophils Absolute: 265 cells/uL (ref 15–500)
Eosinophils Relative: 2.5 %
HCT: 46.5 % (ref 38.5–50.0)
Hemoglobin: 16.3 g/dL (ref 13.2–17.1)
Lymphs Abs: 3774 cells/uL (ref 850–3900)
MCH: 32.2 pg (ref 27.0–33.0)
MCHC: 35.1 g/dL (ref 32.0–36.0)
MCV: 91.9 fL (ref 80.0–100.0)
MPV: 9.9 fL (ref 7.5–12.5)
Monocytes Relative: 7.1 %
Neutro Abs: 5703 cells/uL (ref 1500–7800)
Neutrophils Relative %: 53.8 %
Platelets: 337 10*3/uL (ref 140–400)
RBC: 5.06 10*6/uL (ref 4.20–5.80)
RDW: 12.8 % (ref 11.0–15.0)
Total Lymphocyte: 35.6 %
WBC: 10.6 10*3/uL (ref 3.8–10.8)

## 2021-12-20 LAB — COMPREHENSIVE METABOLIC PANEL
AG Ratio: 1.4 (calc) (ref 1.0–2.5)
ALT: 38 U/L (ref 9–46)
AST: 30 U/L (ref 10–40)
Albumin: 4.5 g/dL (ref 3.6–5.1)
Alkaline phosphatase (APISO): 92 U/L (ref 36–130)
BUN: 15 mg/dL (ref 7–25)
CO2: 23 mmol/L (ref 20–32)
Calcium: 9.5 mg/dL (ref 8.6–10.3)
Chloride: 102 mmol/L (ref 98–110)
Creat: 0.9 mg/dL (ref 0.60–1.29)
Globulin: 3.2 g/dL (calc) (ref 1.9–3.7)
Glucose, Bld: 130 mg/dL — ABNORMAL HIGH (ref 65–99)
Potassium: 4.5 mmol/L (ref 3.5–5.3)
Sodium: 135 mmol/L (ref 135–146)
Total Bilirubin: 1 mg/dL (ref 0.2–1.2)
Total Protein: 7.7 g/dL (ref 6.1–8.1)

## 2021-12-20 LAB — HEPATITIS C RNA QUANTITATIVE
HCV Quantitative Log: <1.18 log IU/mL
HCV RNA, PCR, QN: <15 IU/mL

## 2021-12-24 ENCOUNTER — Ambulatory Visit (HOSPITAL_COMMUNITY)
Admission: RE | Admit: 2021-12-24 | Discharge: 2021-12-24 | Disposition: A | Discharge status: Home / Self Care | Payer: Self-pay | Source: Ambulatory Visit | Attending: Internal Medicine | Admitting: Internal Medicine

## 2021-12-24 DIAGNOSIS — B1920 Unspecified viral hepatitis C without hepatic coma: Secondary | ICD-10-CM | POA: Insufficient documentation

## 2021-12-24 MED ORDER — GADOBUTROL 1 MMOL/ML IV SOLN
9.0000 mL | Freq: Once | INTRAVENOUS | Status: AC | PRN
  Administered 2021-12-24: 9 mL via INTRAVENOUS

## 2021-12-31 ENCOUNTER — Telehealth: Payer: Self-pay | Admitting: Internal Medicine

## 2021-12-31 DIAGNOSIS — C801 Malignant (primary) neoplasm, unspecified: Secondary | ICD-10-CM

## 2021-12-31 NOTE — Telephone Encounter (Signed)
Patient called office and request provider call him to review MRI results. Patient has appointment on 1/4, but would like to go over this before appointment.  Will forward message to provider. Leatrice Jewels, RMA

## 2021-12-31 NOTE — Telephone Encounter (Signed)
Called pt in regards to MRI with c/f malignancy involving colon, ribs. Pt did not pick up left VM to call back  Plan: Reached out GI. Pt had colonoscopy this year w/ f/u in 10 years CT CAP ordered Referral to hem-onc

## 2021-12-31 NOTE — Telephone Encounter (Signed)
Patient aware and to keep appointment with Dr.Singh on 01/08/2021.  Sour John, CMA

## 2022-01-06 ENCOUNTER — Telehealth: Payer: Self-pay | Admitting: Internal Medicine

## 2022-01-06 NOTE — Telephone Encounter (Signed)
Called pt to review imaging results and plan.

## 2022-01-08 ENCOUNTER — Encounter: Payer: Self-pay | Admitting: Internal Medicine

## 2022-01-08 ENCOUNTER — Ambulatory Visit (INDEPENDENT_AMBULATORY_CARE_PROVIDER_SITE_OTHER): Payer: Self-pay | Admitting: Internal Medicine

## 2022-01-08 ENCOUNTER — Other Ambulatory Visit: Payer: Self-pay

## 2022-01-08 VITALS — BP 120/74 | HR 68 | Temp 98.1°F | Ht 71.0 in | Wt 196.0 lb

## 2022-01-08 DIAGNOSIS — B182 Chronic viral hepatitis C: Secondary | ICD-10-CM

## 2022-01-08 NOTE — Progress Notes (Signed)
[place] [phone]   [phone]  INITIAL CONSULTATION:  Patient Care Team: Placey, Audrea Muscat, NP as PCP - General  CHIEF COMPLAINTS/PURPOSE OF CONSULTATION:  Abnormal MRI scan suspicious for malignancy     HISTORY OF PRESENTING ILLNESS:  Justin Newman 47 y.o. male with medical history significant for treated hepatitis C and cirrhosis that presents to the diagnostic clinic for evaluation of abnormal MRI abdomen that is concerning for underlying malignancy. He is unaccompanied for this visit.   On review of the previous records, Justin Newman underwent MR imaging of the abdomen on 12/24/2021 in the setting of cirrhosis and further evaluation of a hepatic lesion seen on prior ultrasound. Findings show cirrhotic liver and right liver lesion that is compatible with hepatic hemangioma. Incidental findings including probable enhancing soft tissue nodularity along the anterior transverse colon. Additionally, there were enlarged/prominent lymph nodes in the RUQ, ileocolic mesentery and jejunal mesentery. There was nodular hyperenhancement along the lateral right 11th and 10th ribs with enhancing soft tissue nodule with musculature of the posterior right flank.   On exam today, Justin Newman reports feeling well without any new symptoms. His energy levels are stable and he is able to complete his ADLs on his own. He has a good appetite and denies any noticeable weight changes. He denies nausea, vomiting or abdominal pain. His bowel habits are unchanged without recurrent episodes of diarrhea or constipation. He denies easy bruising or signs of active bleeding. He denies fevers, chills, sweats, shortness of breath, chest pain or cough. He has no other complaints. Rest of the 10 point ROS is below.   MEDICAL HISTORY:  Past Medical History:  Diagnosis Date   Cirrhosis ([place])    COVID-19 03/02/2021   GERD (gastroesophageal reflux disease)     Hepatitis C    Hydrocele, left    RECURRENT    SURGICAL HISTORY: Past Surgical History:  Procedure Laterality Date   HYDROCELE EXCISION Left 10/18/2015   Procedure: HYDROCELECTOMY ADULT;  Surgeon: Cleon Gustin, MD;  Location: [facility];  Service: Urology;  Laterality: Left;   HYDROCELE EXCISION Left 03/02/2016   Procedure: HYDROCELECTOMY ADULT;  Surgeon: Cleon Gustin, MD;  Location[facility];  Service: Urology;  Laterality: Left;   INGUINAL HERNIA REPAIR Right 1995  aprrox    SOCIAL HISTORY: Social History   Socioeconomic History   Marital status: Married    Spouse name: Not on file   Number of children: Not on file   Years of education: Not on file   Highest education level: Not on file  Occupational History   Not on file  Tobacco Use   Smoking status: Former    Packs/day: 0.50    Years: 30.00    Total pack years: 15.00    Types: Cigarettes   Smokeless tobacco: Former    Types: Snuff   Tobacco comments:    Working on cutting back; started smoking again    Quit smoking before christmas 2023  Vaping Use   Vaping Use: Never used  Substance and Sexual Activity   Alcohol use: No    Comment: no alcohol 2.5 years 10/06/20   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  MEDICATIONS:  Current Outpatient Medications  Medication Sig Dispense Refill    ibuprofen (ADVIL,MOTRIN) 200 MG tablet Take 200 mg by mouth every 6 (six) hours as needed.     omeprazole (PRILOSEC) 20 MG capsule Take 1 capsule (20 mg total) by mouth 2 (two) times daily. (Patient taking differently: Take 20 mg by mouth daily.) 60 capsule 1   calcium carbonate (TUMS - DOSED IN MG ELEMENTAL CALCIUM) 500 MG chewable tablet Chew 1 tablet by mouth as needed for indigestion or heartburn.     famotidine (PEPCID) 10 MG tablet Take 10 mg by mouth daily as needed for heartburn or indigestion.     triamcinolone cream (KENALOG) 0.1 % Apply 1 Application topically 2 (two) times daily. 30 g 0   Current Facility-Administered Medications  Medication Dose Route Frequency Provider Last Rate Last Admin   0.9 %  sodium chloride infusion  500 mL Intravenous Once Daryel November, MD        REVIEW OF SYSTEMS:   Constitutional: ( - ) fevers, ( - )  chills , ( - ) night sweats Eyes: ( - ) blurriness of vision, ( - ) double vision, ( - ) watery eyes Ears, nose, mouth, throat, and face: ( - ) mucositis, ( - ) sore throat Respiratory: ( - ) cough, ( - ) dyspnea, ( - ) wheezes Cardiovascular: ( - ) palpitation, ( - ) chest discomfort, ( - ) lower extremity swelling Gastrointestinal:  ( - ) nausea, ( - ) heartburn, ( - ) change in bowel habits Skin: ( - ) abnormal skin rashes Lymphatics: ( - ) new lymphadenopathy, ( - ) easy bruising Neurological: ( - ) numbness, ( - ) tingling, ( - ) new weaknesses Behavioral/Psych: ( - ) mood change, ( - ) new changes  All other systems were reviewed with the patient and are negative.  PHYSICAL EXAMINATION: ECOG PERFORMANCE STATUS: 0 - Asymptomatic  Vitals:   01/09/22 1109  BP: 129/87  Pulse: 74  Resp: 20  Temp: 98.1 F (36.7 C)  SpO2: 98%   Filed Weights   01/09/22 1109  Weight: 196 lb 4.8 oz (89 kg)    GENERAL: well appearing male in NAD  SKIN: skin color, texture, turgor are normal, no rashes or significant lesions EYES: conjunctiva are pink  and non-injected, sclera clear OROPHARYNX: no exudate, no erythema; lips, buccal mucosa, and tongue normal  NECK: supple, non-tender LYMPH:  no palpable lymphadenopathy in the cervical, axillary or supraclavicular lymph nodes.  LUNGS: clear to auscultation and percussion with normal breathing effort HEART: regular rate & rhythm and no murmurs and no lower extremity edema ABDOMEN: soft, non-tender, non-distended, normal bowel sounds Musculoskeletal: no cyanosis of digits and no clubbing  PSYCH: alert & oriented x 3, fluent speech NEURO: no focal motor/sensory deficits  LABORATORY DATA:  I have reviewed the data as listed    Latest Ref Rng & Units 01/09/2022   12:18 PM 12/17/2021    9:02 AM 06/18/2021    4:03  PM  CBC  WBC 4.0 - 10.5 K/uL 11.5  10.6  10.7   Hemoglobin 13.0 - 17.0 g/dL 16.8  16.3  15.3   Hematocrit 39.0 - 52.0 % 46.5  46.5  44.8   Platelets 150 - 400 K/uL 343  337  315        Latest Ref Rng & Units 01/09/2022   12:18 PM 12/17/2021    9:02 AM 06/18/2021    4:03 PM  CMP  Glucose 70 - 99 mg/dL 94  130  89   BUN 6 - 20 mg/dL '15  15  20   '$ Creatinine 0.61 - 1.24 mg/dL 0.85  0.90  1.02   Sodium 135 - 145 mmol/L 136  135  139   Potassium 3.5 - 5.1 mmol/L 4.3  4.5  4.0   Chloride 98 - 111 mmol/L 106  102  105   CO2 22 - 32 mmol/L '24  23  26   '$ Calcium 8.9 - 10.3 mg/dL 9.7  9.5  9.7   Total Protein 6.5 - 8.1 g/dL 7.8  7.7  7.5   Total Bilirubin 0.3 - 1.2 mg/dL 0.6  1.0  0.8   Alkaline Phos 38 - 126 U/L 91     AST 15 - 41 U/L '26  30  24   '$ ALT 0 - 44 U/L 53  38  26      RADIOGRAPHIC STUDIES: I have personally reviewed the radiological images as listed and agreed with the findings in the report. MR Abdomen W Wo Contrast  Result Date: 12/27/2021 CLINICAL DATA:  Cirrhosis. Further evaluation of hepatic lesion seen on prior ultrasound. EXAM: MRI ABDOMEN WITHOUT AND WITH CONTRAST TECHNIQUE: Multiplanar multisequence MR imaging of the abdomen was performed both before and  after the administration of intravenous contrast. CONTRAST:  7m GADAVIST GADOBUTROL 1 MMOL/ML IV SOLN COMPARISON:  Ultrasound June 27, 2021 FINDINGS: Lower chest: No acute abnormality. Hepatobiliary: Enlargement of the caudate lobe of the liver with slight hepatic contour nodularity compatible with given diagnosis of cirrhosis. T2 hyperintense 16 mm lesion in the posterior right lobe of the liver on image 12/4 demonstrates peripheral nodular discontinuous enhancement with filling in of the lesion on delayed postcontrast pulse sequences similar in intensity to background aorta compatible with a hepatic hemangioma. Gallbladder is unremarkable. No biliary ductal dilation. Pancreas: No pancreatic ductal dilation or evidence of acute inflammation. Spleen:  No splenomegaly or focal splenic lesion. Adrenals/Urinary Tract: Bilateral adrenal glands are within normal limits. No hydronephrosis. No solid enhancing renal mass. Stomach/Bowel: No evidence of bowel obstruction. Probable enhancing nodule along the anterior transverse colon measuring 16 x 14 mm on image 70/10. Vascular/Lymphatic: Enlarged/prominent lymph nodes in the right upper quadrant, ileocolic mesentery and jejunal mesentery. For reference: -Ileocolic mesentery lymph node measures 11 mm in short axis on image 74/10. -periportal lymph node measures 12 mm in short axis on image 43/12. -jejunal mesentery lymph node measures 8 mm in short axis on image 31/7. Other:  No significant abdominal free fluid. Musculoskeletal: Nodular hyperenhancement along the costochondral cartilage of the right eleventh and tenth ribs for instance on image 37/21 and 46/21. Additionally there is an enhancing soft tissue nodule within the musculature of the posterior right flank on image 52/21. IMPRESSION: 1. Cirrhotic liver morphology. T2 hyperintense 16 mm lesion in the posterior right lobe of the liver is compatible with a hepatic hemangioma. 2. Probable enhancing soft tissue  nodularity along the anterior transverse colon is nonspecific and poorly evaluated on  this examination, but in conjunction with the below findings is concerning for neoplastic process, suggest further evaluation with dedicated CT of the abdomen pelvis. 3. Enlarged/prominent lymph nodes in the right upper quadrant, ileocolic mesentery and jejunal mesentery. Probable enhancing nodule anterior to the transverse colon measuring 16 x 14 mm. Findings are nonspecific and could reflect a reactive lymph nodes versus metastatic disease. Attention on follow-up CT suggested. 4. Nodular hyperenhancement along the lateral right eleventh and tenth ribs with enhancing soft tissue nodule within the musculature of the posterior right flank. Findings are nonspecific possibly reflecting sequela of trauma however metastatic disease is a pertinent differential consideration warranting further evaluation. Suggest attention on dedicated chest CT. These results will be called to the ordering clinician or representative by the Radiologist Assistant, and communication documented in the PACS or [employer]. Electronically Signed   By: Dahlia Bailiff M.D.   On: 12/27/2021 09:34    ASSESSMENT & PLAN Justin Newman is a 47 y.o. male who presents to the diagnostic clinic due to recent MRI abdomen that showed findings concerning for underlying malignancy. WE reviewed the MRI images in detail that showed some nonspecific soft tissue nodularity along the transverse colon, enlarged lymph nodes in the abdomen and nodular hyperenhancement along the right 10th and 11th ribs. Physical exam findings were unremarkable and patient is asymptomatic. We discussed recommended workup including laboratory evaluation today followed up CT CAP imaging which is scheduled for 01/12/2022. Based on CT imaging, we can determine if biopsy is needed and which site to target. Patient expressed understanding of the plan and agreed to move forward with the workup.    #Soft tissue nodularity along anterior transverse colon #Prominent lymph nodes in RUQ/ileocolic mesentery/jejunal mesentery #Nodular hyperenhancement along lateral right 10th and 11th ribs --Seen on MRI abdomen from 12/24/2021 --Scheduled for CT CAP with contrast on 01/12/2022 --Labs today to check CBC, CMP, LDH levels. Will check CEA and CA125 tumor markers --Will evaluate for biopsy based on upcoming CT imaging. --RTC once workup is complete.   Orders Placed This Encounter  Procedures   CBC with Differential ([place] Only)    Standing Status:   Future    Number of Occurrences:   1    Standing Expiration Date:   01/09/2023   CMP ([facility] only)    Standing Status:   Future    Number of Occurrences:   1    Standing Expiration Date:   01/09/2023   CEA (Access)-[facility] ONLY    Standing Status:   Future    Number of Occurrences:   1    Standing Expiration Date:   01/09/2023   CA 125    Standing Status:   Future    Number of Occurrences:   1    Standing Expiration Date:   01/08/2023   Lactate dehydrogenase (LDH)    Standing Status:   Future    Number of Occurrences:   1    Standing Expiration Date:   01/09/2023    All questions were answered. The patient knows to call the clinic with any problems, questions or concerns.  I have spent a total of 60 minutes minutes of face-to-face and non-face-to-face time, preparing to see the patient, obtaining and/or reviewing separately obtained history, performing a medically appropriate examination, counseling and educating the patient, ordering tests/procedures, documenting clinical information in the electronic health record, and care coordination.   Dede Query, PA-C Department of Hematology/Oncology [place] at [facility] Phone: [phone]  Patient was seen with Dr. Lorenso Courier  I have read the above note and personally examined the patient. I agree with the assessment and plan as noted above.  Briefly Mr.  Justin Newman is a 47 year old male with medical history significant for nodularities found on routine screening of his liver for cirrhosis.  An MRI performed on 12/24/2021 showed enhancing soft tissue nodularity along the anterior transverse colon as well as enlarged/prominent lymph nodes in the right upper quadrant, with the largest measuring 16 x 14 mm.  Due to concern for these findings the patient was referred to our clinic for further evaluation and management.  At this time it is unclear what these findings represent.  Would recommend a CT chest abdomen pelvis in order to evaluate further.   Ledell Peoples, MD Department of Hematology/Oncology [place] at [facility] Phone: [phone] Pager: [phone] Email: [email or web address]

## 2022-01-08 NOTE — Progress Notes (Signed)
[facility]                                      [address], [place], [place], [address]                                               Phn. [phone]; Fax: [phone]                                                               Date:  Reason for Visit: Hepatitis C    HPI: Justin Newman is a 47 y.o.old male with HCV SP mavyret x 8 weeks with SVR< cirrhosis based on imaging. 6 month routine U/s Showed c/f hemagioma with recc MRI. 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 No new complaints today. Started plant based diet.   ROS: Denies yellowish discoloration of sclera and skin, abdominal pain/distension, hematemesis.  Denis cough, fever, chills, nightsweats, nausea, vomiting, diarrhea, constipation, weight loss, recent hospitalizations, rashes, joint complaints, shortness of breath, chest pain, headaches, dysuria .   Current Outpatient Medications on File Prior to Visit  Medication Sig Dispense Refill   calcium carbonate (TUMS - DOSED IN MG ELEMENTAL CALCIUM) 500 MG chewable tablet Chew 1 tablet by mouth as needed for indigestion or heartburn. (Patient not taking: Reported on 09/17/2021)     famotidine (PEPCID) 10 MG tablet Take 10 mg by mouth daily as needed for heartburn or indigestion. (Patient not taking: Reported on 03/26/2021)     ibuprofen (ADVIL,MOTRIN) 200 MG tablet Take 200 mg by mouth every 6 (six) hours as needed. (Patient not taking: Reported on 09/17/2021)     omeprazole (PRILOSEC) 20 MG capsule Take 1 capsule (20 mg total) by mouth 2 (two) times daily. (Patient not taking: Reported on 09/17/2021) 60 capsule 1   triamcinolone cream (KENALOG) 0.1 % Apply 1 Application topically 2 (two) times daily. (Patient not taking: Reported on 12/17/2021) 30 g 0   Current Facility-Administered Medications on File Prior to Visit  Medication Dose Route Frequency Provider Last Rate Last Admin   0.9 %   sodium chloride infusion  500 mL Intravenous Once Daryel November, MD       No Known Allergies  Past Medical History:  Diagnosis Date   Cirrhosis ([place])    COVID-19 03/02/2021   GERD (gastroesophageal reflux disease)    Hepatitis C    Hydrocele, left    RECURRENT    Past Surgical History:  Procedure Laterality Date   HYDROCELE EXCISION Left 10/18/2015   Procedure: HYDROCELECTOMY ADULT;  Surgeon: Cleon Gustin, MD;  Location: [facility];  Service: Urology;  Laterality: Left;   HYDROCELE EXCISION Left 03/02/2016   Procedure: HYDROCELECTOMY ADULT;  Surgeon: Cleon Gustin, MD;  Location: [facility];  Service: Urology;  Laterality: Left;   INGUINAL HERNIA REPAIR Right 1995  aprrox    Social History   Socioeconomic History   Marital status: Married    Spouse name: Not on file   Number of children: Not on  file   Years of education: Not on file   Highest education level: Not on file  Occupational History   Not on file  Tobacco Use   Smoking status: Every Day    Packs/day: 0.50    Years: 30.00    Total pack years: 15.00    Types: Cigarettes    Last attempt to quit: 03/04/2021    Years since quitting: 0.8   Smokeless tobacco: Former    Types: Snuff   Tobacco comments:    Working on cutting back; started smoking again  Vaping Use   Vaping Use: Never used  Substance and Sexual Activity   Alcohol use: No    Comment: no alcohol 2.5 years 10/06/20   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  Gen: Alert and oriented x 3, no acute distress HEENT: [place]/AT, PERL, EOMI, no scleral icterus, no pale conjunctivae, hearing normal, oral mucosa moist Neck: Supple, no lymphadenopathy Cardio: Regular rate and rhythm; +S1 and S2; no murmurs, gallops, or rubs Resp: CTAB; no wheezes, rhonchi, or rales GI: Soft, nontender, nondistended, bowel sounds present GU: Musc: Extremities: No cyanosis, clubbing, or edema; +2 PT and DP pulses Skin: No rashes, lesions, or ecchymoses Neuro: No focal deficits Psych: Calm, cooperative   Laboratory  CBC w diff CMP PT/PTT/INR Hep A and B serologies  HIV HCV genotype HCV RNA Measure of fibrosis NS5A resistance testing in select scenarios    Assessment/Plan: Assessment/Plan: #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 Genotype 3. Completed 8 weeks of Mavyret on 01/01/21, SVR on 3/22 -EGD on 03/25/21 showed no varices. Plan on repeat EGD in 8 weeks after taking omeprazole twice daily. Pt has not gotten in done yet due parment reasons. He plans to get it in August, 2023 - Last [place] on 6/23 showed probable hepatic hemangioma 9m greatest diameter, confirm with MR imaging. Mildly nodular hepatic margin wihtout mass. -MRI on 12/20 showed lesion in the posterior right liver consistent with hepatic hemangioma.  Soft tissue nodularity along anterior transverse colon concerning for neoplastic process.  Enlarged lymph nodes in the right upper quadrant ileocolic mesentery and jejunal mesentery concerning for reactive lymph nodes versus metastatic disease, recommend CT.  Nodular hyperenhancement along lateral right 11th and 10th rib, enhancing soft tissue nodule musculature posterior right flank concern for metastatic disease.  Plan: -Reached out GI. Pt had  colonoscopy this year w/ f/u in 10 years. Pt spoke with GI, appt pending CT -CT CAP ordered(1/8) -Referral to hem-onc(pt states appt tomorrow) -F/U in one month to discuss any further w/u from ID perspective   #Plam rash-chronic -Pt plans to F/U with Derm(pt hasn't called for appt). Rash could be cutaneous stigmata of  cirrhosis.    #Tobacco abuse -30 pack year smoking HX. Quit multiple times. Re-started a couple months ago and quit around [place].  -Not interested in gum/patch  Electronically signed by:  Laurice Record, MD Infectious Diseases  Fax no. [phone]   I have personally spent 40 minutes involved in face-to-face and non-face-to-face activities for this patient on the day of the visit. Professional time spent includes the following activities: Preparing to see the patient (review of tests), Obtaining and/or reviewing separately obtained history (admission/discharge record), Performing a medically appropriate examination and/or evaluation , Ordering medications/tests/procedures, referring and communicating with other health care professionals, Documenting clinical information in the EMR, Independently interpreting results (not separately reported), Communicating results to the patient/family/caregiver, Counseling and educating the patient/family/caregiver and Care coordination (not separately reported).

## 2022-01-09 ENCOUNTER — Inpatient Hospital Stay: Payer: Self-pay

## 2022-01-09 ENCOUNTER — Inpatient Hospital Stay: Payer: Self-pay | Attending: Physician Assistant | Admitting: Physician Assistant

## 2022-01-09 ENCOUNTER — Encounter: Payer: Self-pay | Admitting: Physician Assistant

## 2022-01-09 ENCOUNTER — Other Ambulatory Visit: Payer: Self-pay

## 2022-01-09 VITALS — BP 129/87 | HR 74 | Temp 98.1°F | Resp 20 | Wt 196.3 lb

## 2022-01-09 DIAGNOSIS — R1900 Intra-abdominal and pelvic swelling, mass and lump, unspecified site: Secondary | ICD-10-CM | POA: Insufficient documentation

## 2022-01-09 DIAGNOSIS — Z79899 Other long term (current) drug therapy: Secondary | ICD-10-CM | POA: Insufficient documentation

## 2022-01-09 DIAGNOSIS — K7460 Unspecified cirrhosis of liver: Secondary | ICD-10-CM | POA: Insufficient documentation

## 2022-01-09 DIAGNOSIS — B1920 Unspecified viral hepatitis C without hepatic coma: Secondary | ICD-10-CM | POA: Insufficient documentation

## 2022-01-09 DIAGNOSIS — Z87891 Personal history of nicotine dependence: Secondary | ICD-10-CM | POA: Insufficient documentation

## 2022-01-09 LAB — CEA (ACCESS): CEA (CHCC): 1.2 ng/mL (ref 0.00–5.00)

## 2022-01-09 LAB — CMP (CANCER CENTER ONLY)
ALT: 53 U/L — ABNORMAL HIGH (ref 0–44)
AST: 26 U/L (ref 15–41)
Albumin: 4.6 g/dL (ref 3.5–5.0)
Alkaline Phosphatase: 91 U/L (ref 38–126)
Anion gap: 6 (ref 5–15)
BUN: 15 mg/dL (ref 6–20)
CO2: 24 mmol/L (ref 22–32)
Calcium: 9.7 mg/dL (ref 8.9–10.3)
Chloride: 106 mmol/L (ref 98–111)
Creatinine: 0.85 mg/dL (ref 0.61–1.24)
GFR, Estimated: >60 mL/min (ref >60)
Glucose, Bld: 94 mg/dL (ref 70–99)
Potassium: 4.3 mmol/L (ref 3.5–5.1)
Sodium: 136 mmol/L (ref 135–145)
Total Bilirubin: 0.6 mg/dL (ref 0.3–1.2)
Total Protein: 7.8 g/dL (ref 6.5–8.1)

## 2022-01-09 LAB — CBC WITH DIFFERENTIAL (CANCER CENTER ONLY)
Abs Immature Granulocytes: 0.05 10*3/uL (ref 0.00–0.07)
Basophils Absolute: 0.1 10*3/uL (ref 0.0–0.1)
Basophils Relative: 1 %
Eosinophils Absolute: 0.3 10*3/uL (ref 0.0–0.5)
Eosinophils Relative: 2 %
HCT: 46.5 % (ref 39.0–52.0)
Hemoglobin: 16.8 g/dL (ref 13.0–17.0)
Immature Granulocytes: 0 %
Lymphocytes Relative: 31 %
Lymphs Abs: 3.6 10*3/uL (ref 0.7–4.0)
MCH: 32.5 pg (ref 26.0–34.0)
MCHC: 36.1 g/dL — ABNORMAL HIGH (ref 30.0–36.0)
MCV: 89.9 fL (ref 80.0–100.0)
Monocytes Absolute: 0.7 10*3/uL (ref 0.1–1.0)
Monocytes Relative: 6 %
Neutro Abs: 6.8 10*3/uL (ref 1.7–7.7)
Neutrophils Relative %: 60 %
Platelet Count: 343 10*3/uL (ref 150–400)
RBC: 5.17 MIL/uL (ref 4.22–5.81)
RDW: 12.8 % (ref 11.5–15.5)
WBC Count: 11.5 10*3/uL — ABNORMAL HIGH (ref 4.0–10.5)
nRBC: 0 % (ref 0.0–0.2)

## 2022-01-09 LAB — LACTATE DEHYDROGENASE: LDH: 175 U/L (ref 98–192)

## 2022-01-10 LAB — CA 125: Cancer Antigen (CA) 125: 12.1 U/mL

## 2022-01-12 ENCOUNTER — Ambulatory Visit (HOSPITAL_COMMUNITY)
Admission: RE | Admit: 2022-01-12 | Discharge: 2022-01-12 | Disposition: A | Discharge status: Home / Self Care | Payer: Self-pay | Source: Ambulatory Visit | Attending: Internal Medicine | Admitting: Internal Medicine

## 2022-01-12 DIAGNOSIS — C801 Malignant (primary) neoplasm, unspecified: Secondary | ICD-10-CM | POA: Insufficient documentation

## 2022-01-12 MED ORDER — IOHEXOL 350 MG/ML SOLN
75.0000 mL | Freq: Once | INTRAVENOUS | Status: AC | PRN
  Administered 2022-01-12: 75 mL via INTRAVENOUS

## 2022-01-13 ENCOUNTER — Telehealth: Payer: Self-pay | Admitting: Physician Assistant

## 2022-01-13 DIAGNOSIS — R591 Generalized enlarged lymph nodes: Secondary | ICD-10-CM

## 2022-01-22 NOTE — Telephone Encounter (Signed)
I spoke to [name] to review the CT scan results from 01/12/2022. Findings are reassuring for no concerning lesions except for subcentimeter nodes in the retroperitoneum and mesentery. Dr. Lorenso Courier does not recommend biopsy at this time. We will see him back in 3 months for labs and repeat CT imaging to ensure stability.

## 2022-01-23 ENCOUNTER — Telehealth: Payer: Self-pay | Admitting: Hematology and Oncology

## 2022-01-23 NOTE — Telephone Encounter (Signed)
Called patient to schedule f/u per 1/18 secure chat. Spoke with patient. Patient scheduled and notified.

## 2022-02-09 ENCOUNTER — Ambulatory Visit (INDEPENDENT_AMBULATORY_CARE_PROVIDER_SITE_OTHER): Payer: Self-pay | Admitting: Internal Medicine

## 2022-02-09 ENCOUNTER — Encounter: Payer: Self-pay | Admitting: Internal Medicine

## 2022-02-09 ENCOUNTER — Other Ambulatory Visit: Payer: Self-pay

## 2022-02-09 VITALS — BP 141/89 | HR 68 | Temp 97.9°F | Ht 71.0 in | Wt 195.4 lb

## 2022-02-09 DIAGNOSIS — B182 Chronic viral hepatitis C: Secondary | ICD-10-CM

## 2022-02-09 NOTE — Progress Notes (Signed)
[facility]                                      [address], [place], [place], [address]                                               Phn. [phone]; Fax: [phone]                                                               Date:  Reason for Visit: Hepatitis C    HPI: Justin Newman is a 47 y.o.old male with HCV SP mavyret x 8 weeks with SVR, cirrhosis based on imaging. 6 month routine U/s Showed c/f hemagioma with recc MRI. 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 02/09/22: He has been seen by Heme-onc. No new complaints today.     CT on 01/12/22 "IMPRESSION: There is no significant lymphadenopathy in mediastinum. There is no focal pulmonary consolidation or definite nodular densities in the lung fields. There is 5 mm faint density seen in right upper lobe, possibly minimal scarring. Follow-up CT in 3 months may be considered to assess stability or resolution.   Fatty liver. There is 1.9 cm low-density with peripheral nodular enhancement in right lobe suggesting possible hemangioma. There is mild nodularity in liver surface suggesting possible cirrhosis.   There is no evidence of intestinal obstruction or pneumoperitoneum. There is no hydronephrosis. Appendix is not dilated.   There are subcentimeter nodes in retroperitoneum and mesentery, possibly suggesting reactive hyperplasia. Follow-up CT in 3 months may be considered to assess stability in this finding.   Other findings as described in the body of the report.:  Current Outpatient Medications on File Prior to Visit  Medication Sig Dispense Refill   ibuprofen (ADVIL,MOTRIN) 200 MG tablet Take 200 mg by mouth every 6 (six) hours as needed.     omeprazole (PRILOSEC) 20 MG capsule Take 1 capsule (20 mg total) by mouth 2 (two) times daily. (Patient taking differently: Take 20 mg by mouth daily.) 60 capsule 1   Current  Facility-Administered Medications on File Prior to Visit  Medication Dose Route Frequency Provider Last Rate Last Admin   0.9 %  sodium chloride infusion  500 mL Intravenous Once Daryel November, MD       No Known Allergies  Past Medical History:  Diagnosis Date   Cirrhosis ([place])    COVID-19 03/02/2021   GERD (gastroesophageal reflux disease)    Hepatitis C    Hydrocele, left    RECURRENT    Past Surgical History:  Procedure Laterality Date   HYDROCELE EXCISION Left 10/18/2015   Procedure: HYDROCELECTOMY ADULT;  Surgeon: Cleon Gustin, MD;  Location: [facility];  Service: Urology;  Laterality: Left;   HYDROCELE EXCISION Left 03/02/2016   Procedure: HYDROCELECTOMY ADULT;  Surgeon: Cleon Gustin, MD;  Location: [facility];  Service: Urology;  Laterality: Left;   INGUINAL HERNIA REPAIR Right 1995  aprrox    Social History   Socioeconomic History  Marital status: Married    Spouse name: Not on file   Number of children: Not on file   Years of education: Not on file   Highest education level: Not on file  Occupational History   Not on file  Tobacco Use   Smoking status: Former    Packs/day: 0.50    Years: 30.00    Total pack years: 15.00    Types: Cigarettes   Smokeless tobacco: Former    Types: Snuff   Tobacco comments:    Working on cutting back; started smoking again    Quit smoking before christmas 2023  Vaping Use   Vaping Use: Never used  Substance and Sexual Activity   Alcohol use: No    Comment: no alcohol 2.5 years 10/06/20   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  Gen: Alert and oriented x 3, no acute distress HEENT: [place]/AT, PERL, EOMI, no scleral icterus, no pale conjunctivae, hearing normal, oral mucosa moist Neck: Supple, no lymphadenopathy Cardio: Regular rate and rhythm; +S1 and S2; no murmurs, gallops, or rubs Resp: CTAB; no wheezes, rhonchi, or rales GI: Soft, nontender, nondistended, bowel sounds present GU: Musc: Extremities: No cyanosis, clubbing, or edema; +2 PT and DP pulses Skin: No rashes, lesions, or ecchymoses Neuro: No focal deficits Psych: Calm, cooperative      Assessment/Plan: #HCV infection-chronic #Subcentimeter in retroperitoneum and masesentery nodes #87m nodulatiry in right lung #Hemangioma liver -Stage F4, U/S consistent with cirrhosis. Genotype 3. Completed 8 weeks of Mavyret on 01/01/21, SVR on 3/22 -EGD on 03/25/21 showed no varices. Plan on repeat EGD in 8 weeks after taking omeprazole twice daily. Pt has not gotten in done yet due parment reasons. He plans to get it in August, 2023 - Last [place] 6[place]23 showed probable hepatic hemangioma 240mgreatest diameter, confirm with MR imaging. Mildly nodular hepatic margin wihtout mass. -MRI on 12/20 showed lesion in the posterior right liver consistent with hepatic hemangioma.  Soft tissue nodularity along anterior transverse colon concerning for neoplastic process.  Enlarged lymph nodes in the right upper quadrant ileocolic mesentery and jejunal mesentery concerning for reactive lymph nodes versus metastatic disease, recommend CT.  Nodular hyperenhancement along lateral right 11th and 10th rib, enhancing soft tissue nodule musculature posterior right flank concern for metastatic disease.4 - CT on  1/8 showed  T noted 5 mm density in the  right upper lobe of the lungs, possibly minimal scarring, recommended CT in 3 months.  1.9 cm low-density peripheral nodule enhancement in the right lobe of the liver possible hemangioma.  Subcentimeter nodes in the retroperitoneum and mesentery possibly suggesting reactive hyperplasia with follow-up CT in 3 months. No plans for biopsy at this time with heme-onc.  Repeat CT and follow-up  in 3 months. Plan: - Pt spoke with GI, appt pending -Heme-onc f/u with Ct in 3 months -ID follow-up in 6 months, sooner pending 3 month CT results    #Plam rash-chronic -Pt plans to F/U with Derm(pt hasn't called for appt). Rash could be cutaneous stigmata of cirrhosis.    #Tobacco abuse -30 pack year smoking HX. Quit multiple times. Re-started a couple months ago and quit around [place].  -Not interested in gum/patch    Electronically signed by:  Laurice Record, MD Infectious Diseases  Fax no. [phone]

## 2022-04-23 ENCOUNTER — Encounter (HOSPITAL_COMMUNITY): Payer: Self-pay

## 2022-04-23 ENCOUNTER — Ambulatory Visit (HOSPITAL_COMMUNITY)
Admission: RE | Admit: 2022-04-23 | Discharge: 2022-04-23 | Disposition: A | Discharge status: Home / Self Care | Payer: Self-pay | Source: Ambulatory Visit | Attending: Physician Assistant | Admitting: Physician Assistant

## 2022-04-23 DIAGNOSIS — R591 Generalized enlarged lymph nodes: Secondary | ICD-10-CM | POA: Insufficient documentation

## 2022-04-23 MED ORDER — SODIUM CHLORIDE (PF) 0.9 % IJ SOLN
INTRAMUSCULAR | Status: AC
  Filled 2022-04-23: qty 50

## 2022-04-23 MED ORDER — IOHEXOL 300 MG/ML  SOLN
100.0000 mL | Freq: Once | INTRAMUSCULAR | Status: AC | PRN
  Administered 2022-04-23: 100 mL via INTRAVENOUS

## 2022-04-24 ENCOUNTER — Other Ambulatory Visit: Payer: Self-pay | Admitting: Hematology and Oncology

## 2022-04-24 ENCOUNTER — Inpatient Hospital Stay: Payer: Self-pay | Attending: Physician Assistant

## 2022-04-24 ENCOUNTER — Inpatient Hospital Stay (HOSPITAL_BASED_OUTPATIENT_CLINIC_OR_DEPARTMENT_OTHER): Payer: Self-pay | Admitting: Hematology and Oncology

## 2022-04-24 ENCOUNTER — Other Ambulatory Visit: Payer: Self-pay

## 2022-04-24 VITALS — BP 154/95 | HR 67 | Temp 97.9°F | Resp 18 | Wt 192.3 lb

## 2022-04-24 DIAGNOSIS — F17200 Nicotine dependence, unspecified, uncomplicated: Secondary | ICD-10-CM | POA: Insufficient documentation

## 2022-04-24 DIAGNOSIS — R591 Generalized enlarged lymph nodes: Secondary | ICD-10-CM

## 2022-04-24 DIAGNOSIS — D72829 Elevated white blood cell count, unspecified: Secondary | ICD-10-CM | POA: Insufficient documentation

## 2022-04-24 DIAGNOSIS — Z79899 Other long term (current) drug therapy: Secondary | ICD-10-CM | POA: Insufficient documentation

## 2022-04-24 LAB — CMP (CANCER CENTER ONLY)
ALT: 55 U/L — ABNORMAL HIGH (ref 0–44)
AST: 28 U/L (ref 15–41)
Albumin: 4.1 g/dL (ref 3.5–5.0)
Alkaline Phosphatase: 92 U/L (ref 38–126)
Anion gap: 7 (ref 5–15)
BUN: 19 mg/dL (ref 6–20)
CO2: 24 mmol/L (ref 22–32)
Calcium: 9 mg/dL (ref 8.9–10.3)
Chloride: 103 mmol/L (ref 98–111)
Creatinine: 0.92 mg/dL (ref 0.61–1.24)
GFR, Estimated: >60 mL/min (ref >60)
Glucose, Bld: 316 mg/dL — ABNORMAL HIGH (ref 70–99)
Potassium: 4.3 mmol/L (ref 3.5–5.1)
Sodium: 134 mmol/L — ABNORMAL LOW (ref 135–145)
Total Bilirubin: 0.4 mg/dL (ref 0.3–1.2)
Total Protein: 7.5 g/dL (ref 6.5–8.1)

## 2022-04-24 LAB — CBC WITH DIFFERENTIAL (CANCER CENTER ONLY)
Abs Immature Granulocytes: 0.22 10*3/uL — ABNORMAL HIGH (ref 0.00–0.07)
Basophils Absolute: 0.2 10*3/uL — ABNORMAL HIGH (ref 0.0–0.1)
Basophils Relative: 2 %
Eosinophils Absolute: 0.3 10*3/uL (ref 0.0–0.5)
Eosinophils Relative: 2 %
HCT: 45.5 % (ref 39.0–52.0)
Hemoglobin: 15.7 g/dL (ref 13.0–17.0)
Immature Granulocytes: 2 %
Lymphocytes Relative: 26 %
Lymphs Abs: 3 10*3/uL (ref 0.7–4.0)
MCH: 32.8 pg (ref 26.0–34.0)
MCHC: 34.5 g/dL (ref 30.0–36.0)
MCV: 95 fL (ref 80.0–100.0)
Monocytes Absolute: 0.7 10*3/uL (ref 0.1–1.0)
Monocytes Relative: 6 %
Neutro Abs: 7.3 10*3/uL (ref 1.7–7.7)
Neutrophils Relative %: 62 %
Platelet Count: 339 10*3/uL (ref 150–400)
RBC: 4.79 MIL/uL (ref 4.22–5.81)
RDW: 13.5 % (ref 11.5–15.5)
WBC Count: 11.7 10*3/uL — ABNORMAL HIGH (ref 4.0–10.5)
nRBC: 0 % (ref 0.0–0.2)

## 2022-04-24 LAB — LACTATE DEHYDROGENASE: LDH: 147 U/L (ref 98–192)

## 2022-04-24 NOTE — Progress Notes (Unsigned)
[facility] [phone]   [phone]  PROGRESS NOTE  Patient Care Team: [name], NP as PCP - General  Hematological/Oncological History # Abnormal MRI scan suspicious for malignancy    12/24/2021: MRI abdomen shows probable enhancing soft tissue nodularity along the anterior transverse colon is nonspecific and poorly evaluated on this examination, but in conjunction with the below findings is concerning for neoplastic process 01/12/2022: CT C/A/P showed no evidence of previously noted tissue nodularity 04/23/2022: CT C/A/P showed no evidence of previousy noted tissue nodularity  Interval History:  Justin Newman 47 y.o. male with medical history significant for abnormal MRI findings who presents for a follow up visit. The patient's last visit was on 01/09/2022. In the interim since the last visit he has had no major changes in his health.  On exam today Justin Newman reports that he is "feeling good".  He is not having any difficulty with abdominal pain, distention, or bloating.  He is not having any nausea, vomiting or diarrhea.  His weight is good and his appetite is strong.  He reports that he recently did change his diet to be vegan.  He reports that he did have a sinus infection about 2 weeks ago which does appear to be lingering still.  He also notes unfortunately he started back smoking but is making a plan this weekend to quit.  His energy levels are good and otherwise he is at his baseline level of health.  He denies any fevers, chills, sweats, nausea, vomiting, or diarrhea.  A full 10 point ROS was otherwise negative.  MEDICAL HISTORY:  Past Medical History:  Diagnosis Date   Cirrhosis    COVID-19 03/02/2021   GERD (gastroesophageal reflux disease)    Hepatitis C    Hydrocele, left    RECURRENT    SURGICAL HISTORY: Past Surgical History:  Procedure Laterality Date   HYDROCELE EXCISION Left 10/18/2015   Procedure: HYDROCELECTOMY ADULT;   Surgeon: Malen Gauze, MD;  Location: [facility];  Service: Urology;  Laterality: Left;   HYDROCELE EXCISION Left 03/02/2016   Procedure: HYDROCELECTOMY ADULT;  Surgeon: Malen Gauze, MD;  Location: [facility];  Service: Urology;  Laterality: Left;   INGUINAL HERNIA REPAIR Right 1995  aprrox    SOCIAL HISTORY: Social History   Socioeconomic History   Marital status: Married    Spouse name: Not on file   Number of children: Not on file   Years of education: Not on file   Highest education level: Not on file  Occupational History   Not on file  Tobacco Use   Smoking status: Former    Packs/day: 0.50    Years: 30.00    Additional pack years: 0.00    Total pack years: 15.00    Types: Cigarettes   Smokeless tobacco: Former    Types: Snuff   Tobacco comments:    Working on cutting back; started smoking again    Quit smoking before christmas 2023  Vaping Use   Vaping Use: Never used  Substance and Sexual Activity   Alcohol use: No    Comment: no alcohol 2.5 years 10/06/20   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  MEDICATIONS:  Current Outpatient Medications  Medication Sig Dispense Refill   ibuprofen (ADVIL,MOTRIN) 200 MG tablet Take 200 mg by mouth every 6 (six) hours as needed. (Patient not taking: Reported on 02/09/2022)     omeprazole (PRILOSEC) 20 MG  capsule Take 1 capsule (20 mg total) by mouth 2 (two) times daily. (Patient not taking: Reported on 02/09/2022) 60 capsule 1   Current Facility-Administered Medications  Medication Dose Route Frequency Provider Last Rate Last Admin   0.9 %  sodium chloride infusion  500 mL Intravenous Once Jenel Lucks, MD        REVIEW OF SYSTEMS:   Constitutional: ( - ) fevers, ( - )  chills , ( - ) night sweats Eyes: ( - ) blurriness of vision, ( - ) double vision, ( - ) watery eyes Ears, nose, mouth, throat, and face: ( - ) mucositis, ( - ) sore throat Respiratory: ( - ) cough, ( - ) dyspnea, ( - ) wheezes Cardiovascular: ( - ) palpitation, ( - ) chest discomfort, ( - ) lower extremity swelling Gastrointestinal:  ( - ) nausea, ( - ) heartburn, ( - ) change in bowel habits Skin: ( - ) abnormal skin rashes Lymphatics: ( - ) new lymphadenopathy, ( - ) easy bruising Neurological: ( - ) numbness, ( - ) tingling, ( - ) new weaknesses Behavioral/Psych: ( - ) mood change, ( - ) new changes  All other systems were reviewed with the patient and are negative.  PHYSICAL EXAMINATION:  Vitals:   04/24/22 1011  BP: (!) 154/95  Pulse: 67  Resp: 18  Temp: 97.9 F (36.6 C)  SpO2: 97%   Filed Weights   04/24/22 1011  Weight: 192 lb 4.8 oz (87.2 kg)    GENERAL: Well-appearing middle-aged Caucasian male, alert, no distress and comfortable SKIN: skin color, texture, turgor are normal, no rashes or significant lesions EYES: conjunctiva are pink and non-injected, sclera clear LUNGS: clear to auscultation and percussion with normal breathing effort HEART: regular rate & rhythm and no murmurs and no lower extremity edema Musculoskeletal: no cyanosis of digits and no clubbing  PSYCH: alert & oriented x 3, fluent speech NEURO: no focal motor/sensory deficits  LABORATORY DATA:  I have reviewed the data as listed    Latest Ref Rng & Units 04/24/2022    9:36 AM 01/09/2022   12:18 PM 12/17/2021    9:02 AM   CBC  WBC 4.0 - 10.5 K/uL 11.7  11.5  10.6   Hemoglobin 13.0 - 17.0 g/dL 40.9  81.1  91.4   Hematocrit 39.0 - 52.0 % 45.5  46.5  46.5   Platelets 150 - 400 K/uL 339  343  337        Latest Ref Rng & Units 04/24/2022    9:36 AM 01/09/2022   12:18 PM 12/17/2021    9:02 AM  CMP  Glucose 70 - 99 mg/dL 782  94  956   BUN 6 - 20 mg/dL 19  15  15  Creatinine 0.61 - 1.24 mg/dL 1.61  0.96  0.45   Sodium 135 - 145 mmol/L 134  136  135   Potassium 3.5 - 5.1 mmol/L 4.3  4.3  4.5   Chloride 98 - 111 mmol/L 103  106  102   CO2 22 - 32 mmol/L Calcium 8.9 - 10.3 mg/dL 9.0  9.7  9.5   Total Protein 6.5 - 8.1 g/dL 7.5  7.8  7.7   Total Bilirubin 0.3 - 1.2 mg/dL 0.4  0.6  1.0   Alkaline Phos 38 - 126 U/L 92  91    AST 15 - 41 U/L ALT 0 - 44 U/L 55  53  38    RADIOGRAPHIC STUDIES: CT CHEST ABDOMEN PELVIS W CONTRAST  Result Date: 04/24/2022 CLINICAL DATA:  47 year old male previously evaluated for "concern for malignancy". History of hepatitis C, cirrhosis. Prominent lymph nodes in the abdomen and pelvis on prior imaging. EXAM: CT CHEST, ABDOMEN, AND PELVIS WITH CONTRAST TECHNIQUE: Multidetector CT imaging of the chest, abdomen and pelvis was performed following the standard protocol during bolus administration of intravenous contrast. RADIATION DOSE REDUCTION: This exam was performed according to the departmental dose-optimization program which includes automated exposure control, adjustment of the mA and/or kV according to patient size and/or use of iterative reconstruction technique. CONTRAST:  OMNIPAQUE IOHEXOL 300 MG/ML  SOLN COMPARISON:  CT Chest, Abdomen, and Pelvis 01/12/2022, and earlier. FINDINGS: CT CHEST FINDINGS Cardiovascular: No cardiomegaly or pericardial effusion. Major mediastinal vascular structures appear negative. Mediastinum/Nodes: Negative. No mediastinal mass or lymphadenopathy. Bilateral axillary lymph nodes are stable and within normal limits.  Lungs/Pleura: Mild respiratory motion. Major airways are patent. Improved lung volumes compared to January, no residual atelectasis today. Occasional subpleural mild lung scarring. No acute or suspicious pulmonary opacity. Musculoskeletal: No acute or suspicious osseous abnormality. CT ABDOMEN PELVIS FINDINGS Hepatobiliary: Nodular liver contour series 2, image 58. Subtle posterior right lobe hepatic lesion by CT on series 2, image 53, characterized as benign hepatic hemangioma on MRI in December. No other discrete liver lesion by CT. Evidence of underlying steatosis. Negative gallbladder. No bile duct enlargement. Pancreas: Negative. Spleen: Negative.  No splenomegaly. Adrenals/Urinary Tract: Normal adrenal glands. Symmetric and negative kidneys. Unremarkable urinary bladder. Stomach/Bowel: No ascites. No dilated large or small bowel. Negative retrocecal appendix (series 2, image 93). No bowel inflammation. No free air. Vascular/Lymphatic: Mild but age advanced Aortoiliac calcified atherosclerosis. And some soft plaque in the proximal femoral arteries. But the major arterial structures remain patent. Early portal venous phase timing, grossly patent portal venous system. Small caliber upper abdominal, mesenteric varices are suspected (such as series 2, image 84. Abdominal and pelvic lymph nodes are stable and within normal limits in the setting of chronic liver disease (small probably reactive upper abdominal nodes). No lymph nodes are pathologically enlarged, increased, or suspicious. Reproductive: Negative. Other: No pelvis free fluid.  Occasional pelvic phleboliths. Musculoskeletal: No acute or suspicious osseous abnormality. IMPRESSION: 1. Cirrhosis, with mild stigmata of portal venous hypertension (suspected small omental varices). No splenomegaly, ascites. 2. No suspicious or increased lymph nodes in the chest, abdomen, or pelvis. 3. No superimposed acute or inflammatory process in the chest, abdomen, or  pelvis. 4. Age advanced atherosclerosis in the abdomen and pelvis, Aortic Atherosclerosis (ICD10-I70.0). Electronically Signed   By: Odessa Fleming M.D.   On: 04/24/2022 11:05    ASSESSMENT & PLAN Moussa Wiegand 47 y.o. male with  medical history significant for abnormal MRI findings who presents for a follow up visit.  # Abnormal MRI scan suspicious for malignancy    -- At this time assume that the soft tissue mass seen on MRI was artifact.  2 CT scans separate advised 3 months of show no evidence of this lesion. -- Return to routine cancer screening and no need for follow-up in our clinic.  # Leukocytosis -- Likely secondary to restarting smoking or lingering sinus infection -- Continue to monitor.  No orders of the defined types were placed in this encounter.   All questions were answered. The patient knows to call the clinic with any problems, questions or concerns.  A total of more than 30 minutes were spent on this encounter with face-to-face time and non-face-to-face time, including preparing to see the patient, ordering tests and/or medications, counseling the patient and coordination of care as outlined above.   Ulysees Barns, MD Department of Hematology/Oncology [facility] Phone: [phone] Pager: [phone] Email: [email or web address]  04/25/2022 3:40 PM

## 2022-08-11 ENCOUNTER — Ambulatory Visit (INDEPENDENT_AMBULATORY_CARE_PROVIDER_SITE_OTHER): Payer: Self-pay | Admitting: Internal Medicine

## 2022-08-11 ENCOUNTER — Encounter: Payer: Self-pay | Admitting: Internal Medicine

## 2022-08-11 ENCOUNTER — Other Ambulatory Visit: Payer: Self-pay

## 2022-08-11 VITALS — BP 143/94 | HR 68 | Temp 98.0°F | Wt 202.0 lb

## 2022-08-11 DIAGNOSIS — K7460 Unspecified cirrhosis of liver: Secondary | ICD-10-CM
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
## 2022-10-12 ENCOUNTER — Ambulatory Visit (HOSPITAL_COMMUNITY): Admission: RE | Admit: 2022-10-12 | Payer: Self-pay | Source: Ambulatory Visit

## 2022-11-16 ENCOUNTER — Ambulatory Visit: Payer: Self-pay | Admitting: Internal Medicine

## 2022-11-16 ENCOUNTER — Telehealth: Payer: Self-pay

## 2022-11-16 NOTE — Telephone Encounter (Signed)
Attempted to contact patient due to no show visit with Dr. Thedore Mins today. Will forward communication to provider. Staff will also send Mychart message to contact our office for rescheduling.   Valarie Cones, LPN

## 2023-02-01 ENCOUNTER — Ambulatory Visit: Payer: Self-pay | Admitting: Internal Medicine

## 2023-02-03 ENCOUNTER — Other Ambulatory Visit: Payer: Self-pay

## 2023-02-03 ENCOUNTER — Ambulatory Visit (INDEPENDENT_AMBULATORY_CARE_PROVIDER_SITE_OTHER): Payer: Self-pay | Admitting: Internal Medicine

## 2023-02-03 ENCOUNTER — Encounter: Payer: Self-pay | Admitting: Internal Medicine

## 2023-02-03 VITALS — BP 148/86 | HR 76 | Temp 98.2°F | Ht 71.0 in | Wt 206.0 lb

## 2023-02-03 DIAGNOSIS — K7460 Unspecified cirrhosis of liver: Secondary | ICD-10-CM
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
## 2023-02-04 LAB — COMPREHENSIVE METABOLIC PANEL
AG Ratio: 1.4 (calc) (ref 1.0–2.5)
ALT: 46 U/L (ref 9–46)
AST: 28 U/L (ref 10–40)
Albumin: 4.3 g/dL (ref 3.6–5.1)
Alkaline phosphatase (APISO): 88 U/L (ref 36–130)
BUN: 16 mg/dL (ref 7–25)
CO2: 20 mmol/L (ref 20–32)
Calcium: 10.1 mg/dL (ref 8.6–10.3)
Chloride: 105 mmol/L (ref 98–110)
Creat: 0.98 mg/dL (ref 0.60–1.29)
Globulin: 3 g/dL (ref 1.9–3.7)
Glucose, Bld: 119 mg/dL — ABNORMAL HIGH (ref 65–99)
Potassium: 4 mmol/L (ref 3.5–5.3)
Sodium: 140 mmol/L (ref 135–146)
Total Bilirubin: 0.7 mg/dL (ref 0.2–1.2)
Total Protein: 7.3 g/dL (ref 6.1–8.1)

## 2023-02-04 LAB — CBC WITH DIFFERENTIAL/PLATELET
Absolute Lymphocytes: 4098 {cells}/uL — ABNORMAL HIGH (ref 850–3900)
Absolute Monocytes: 567 {cells}/uL (ref 200–950)
Basophils Absolute: 128 {cells}/uL (ref 0–200)
Basophils Relative: 1.2 %
Eosinophils Absolute: 278 {cells}/uL (ref 15–500)
Eosinophils Relative: 2.6 %
HCT: 44.5 % (ref 38.5–50.0)
Hemoglobin: 15.9 g/dL (ref 13.2–17.1)
MCH: 32.5 pg (ref 27.0–33.0)
MCHC: 35.7 g/dL (ref 32.0–36.0)
MCV: 91 fL (ref 80.0–100.0)
MPV: 9.9 fL (ref 7.5–12.5)
Monocytes Relative: 5.3 %
Neutro Abs: 5628 {cells}/uL (ref 1500–7800)
Neutrophils Relative %: 52.6 %
Platelets: 344 10*3/uL (ref 140–400)
RBC: 4.89 10*6/uL (ref 4.20–5.80)
RDW: 12.7 % (ref 11.0–15.0)
Total Lymphocyte: 38.3 %
WBC: 10.7 10*3/uL (ref 3.8–10.8)

## 2023-02-06 IMAGING — US US ABDOMEN COMPLETE W/ ELASTOGRAPHY
1 series · 12 of 25 positions shown · non-contrast
Comparison: None.

CLINICAL DATA: Acute hepatitis C, HCC screen.

EXAM:
ULTRASOUND ABDOMEN
ULTRASOUND HEPATIC ELASTOGRAPHY
TECHNIQUE: Sonography of the upper abdomen was performed. In addition,
ultrasound elastography evaluation of the liver was performed. A
region of interest was placed within the right lobe of the liver.
Following application of a compressive sonographic pulse, tissue
compressibility was assessed. Multiple assessments were performed at
the selected site. Median tissue compressibility was determined.
Previously, hepatic stiffness was assessed by shear wave velocity.
Based on recently published Society of Radiologists in Ultrasound
consensus article, reporting is now recommended to be performed in
the SI units of pressure (kiloPascals) representing hepatic
stiffness/elasticity. The obtained result is compared to the
published reference standards. (cACLD = compensated Advanced Chronic
Liver Disease)

[Series 1: us abdomen complete w/elastography · 12 of 145 slices shown]
[im 7/145]
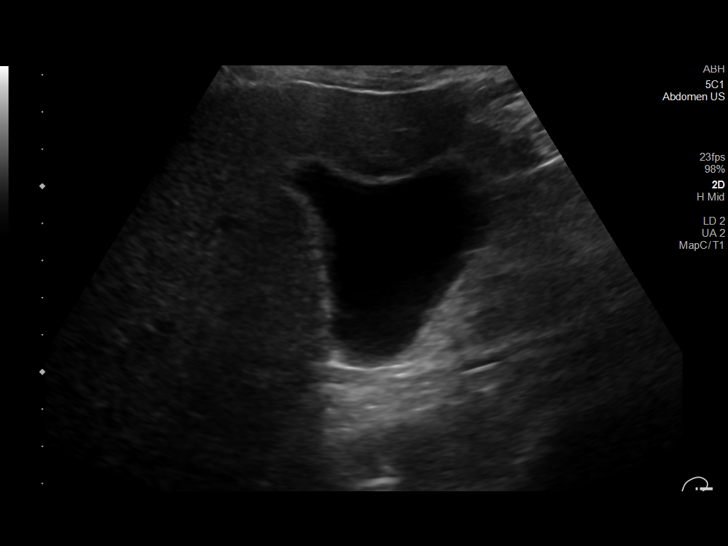
[im 19/145]
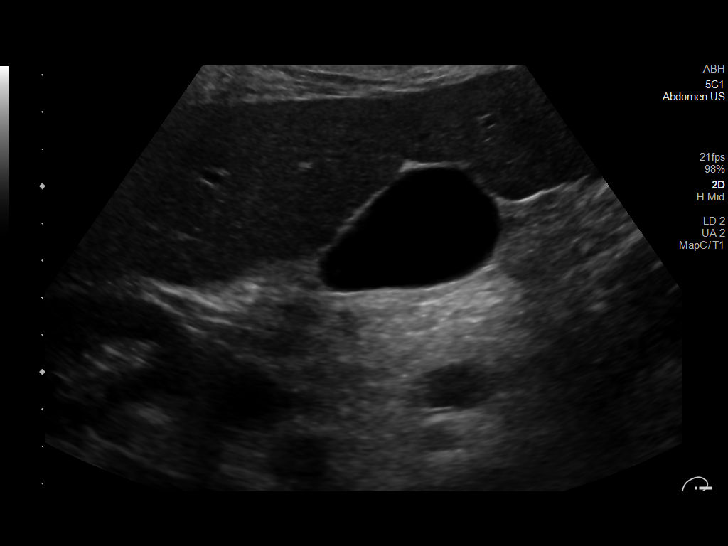
[im 31/145]
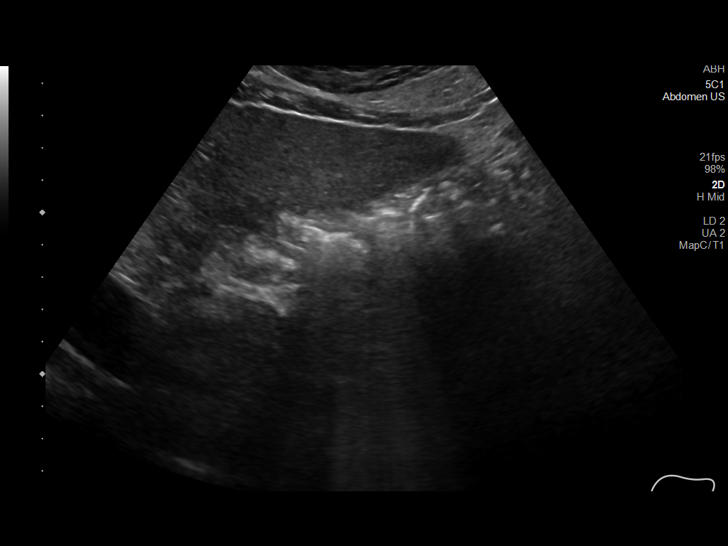
[im 43/145]
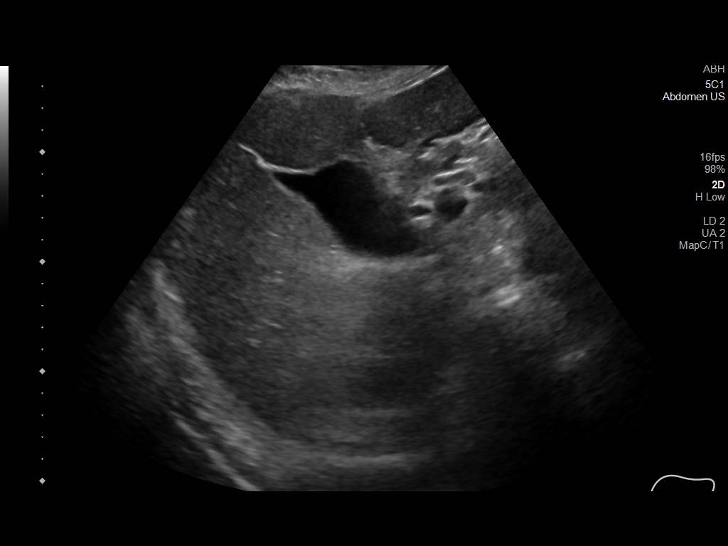
[im 55/145]
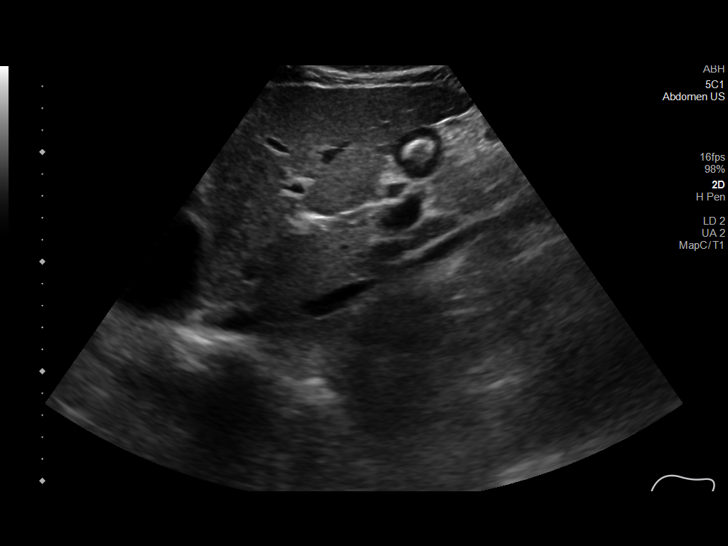
[im 67/145]
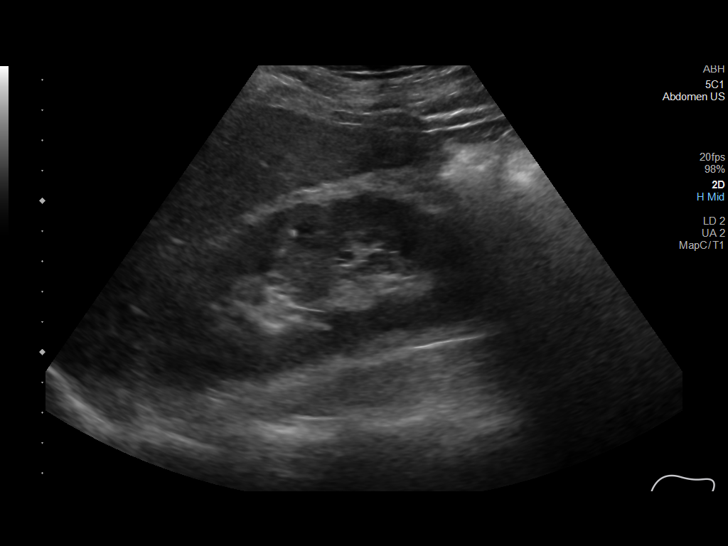
[im 79/145]
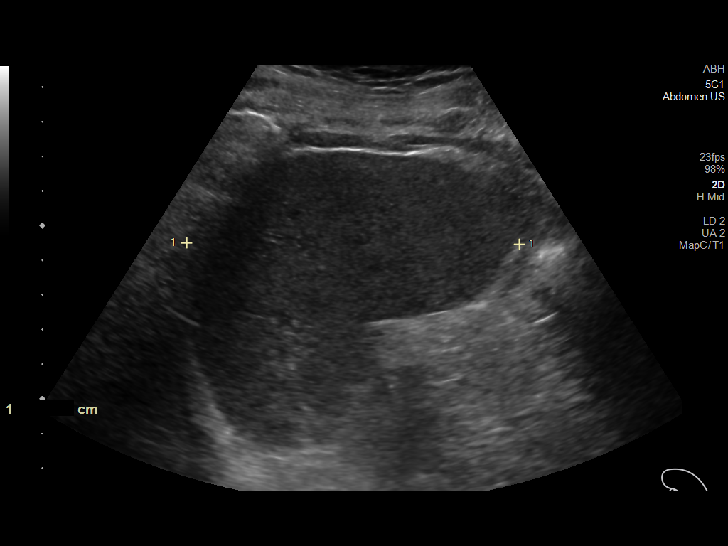
[im 91/145]
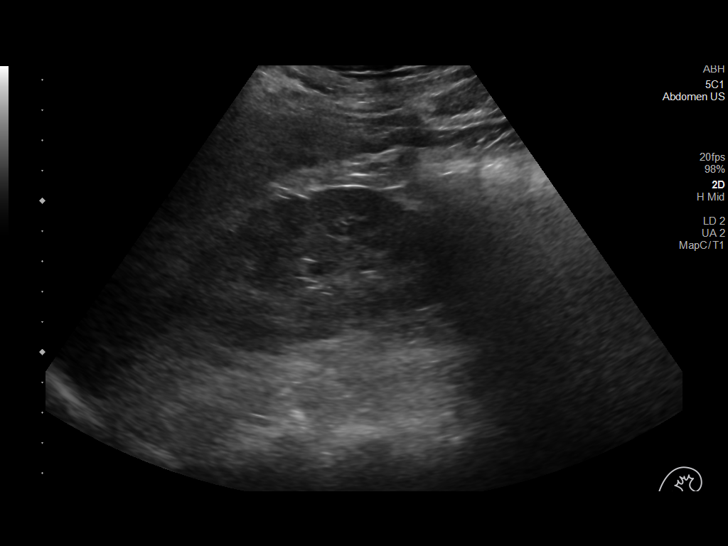
[im 103/145]
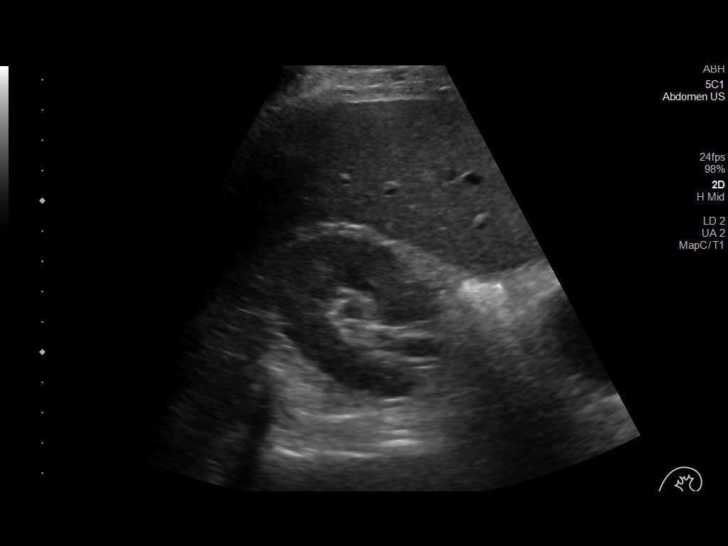
[im 115/145]
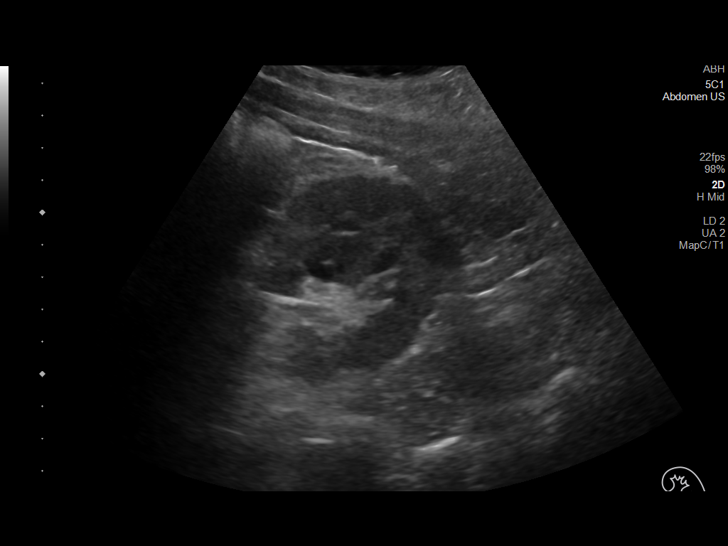
[im 127/145]
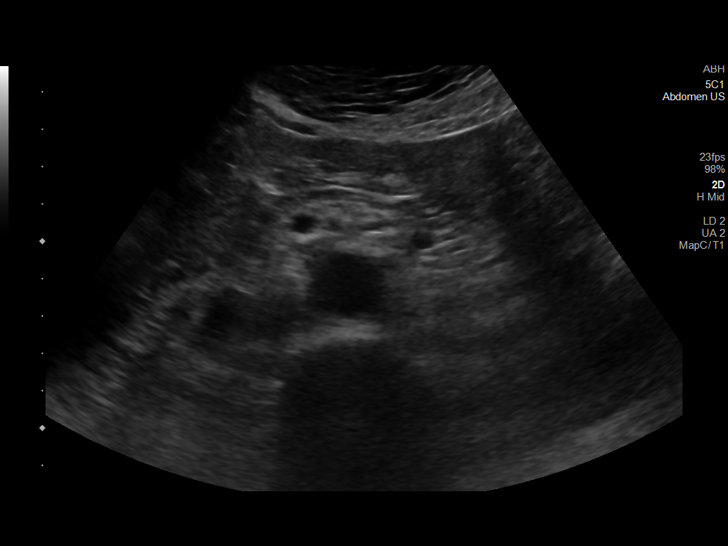
[im 139/145]
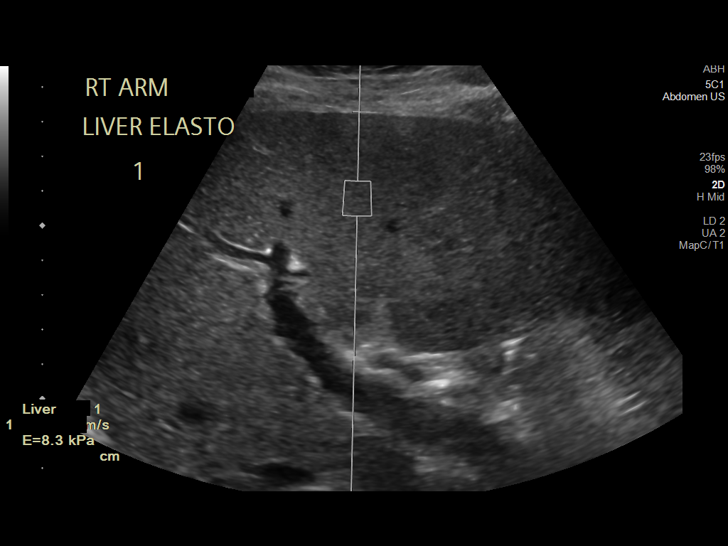

[12 of 25 positions shown; findings below may reference images not displayed]

FINDINGS: ULTRASOUND ABDOMEN

Gallbladder: Gallbladder polyp measuring 3 mm. No gallstones or wall
thickening visualized. No sonographic Murphy sign noted by
sonographer.

Common bile duct: Diameter: 5 mm

Liver: No focal lesion identified. Coarsening of the hepatic
echotexture with contour nodularity. Portal vein is patent on color
Doppler imaging with normal direction of blood flow towards the
liver.

IVC: No abnormality visualized.

Pancreas: Visualized portion unremarkable.

Spleen: Size and appearance within normal limits.

Right Kidney: Length: 13.5 cm. Echogenicity within normal limits. No
mass or hydronephrosis visualized.

Left Kidney: Length: 12.2 cm. Echogenicity within normal limits. No
mass or hydronephrosis visualized.

Abdominal aorta: No aneurysm visualized.

Other findings: None.

ULTRASOUND HEPATIC ELASTOGRAPHY

Device: Siemens Helix VTQ

Patient position: Supine

Transducer [id number]

Number of measurements: 10

Hepatic segment:  A

Median kPa:

IQR:

IQR/Median kPa ratio:

Data quality:  Good

Diagnostic category: >9 kPa and ?13 kPa: suggestive of cACLD, but
needs further testing

The use of hepatic elastography is applicable to patients with viral
hepatitis and non-alcoholic fatty liver disease. At this time, there
is insufficient data for the referenced cut-off values and use in
other causes of liver disease, including alcoholic liver disease.
Patients, however, may be assessed by elastography and serve as
their own reference standard/baseline.

In patients with non-alcoholic liver disease, the values suggesting
compensated advanced chronic liver disease (cACLD) may be lower, and
patients may need additional testing with elasticity results of [DATE]
kPa.

Please note that abnormal hepatic elasticity and shear wave
velocities may also be identified in clinical settings other than
with hepatic fibrosis, such as: acute hepatitis, elevated right
heart and central venous pressures including use of beta blockers,
Noija disease (Patricio), infiltrative processes such as
mastocytosis/amyloidosis/infiltrative tumor/lymphoma, extrahepatic
cholestasis, with hyperemia in the post-prandial state, and with
liver transplantation. Correlation with patient history, laboratory
data, and clinical condition recommended.

Diagnostic Categories:

< or =5 kPa: high probability of being normal

< or =9 kPa: in the absence of other known clinical signs, rules [DATE] kPa and ?13 kPa: suggestive of cACLD, but needs further testing

>13 kPa: highly suggestive of cACLD

> or =17 kPa: highly suggestive of cACLD with an increased
probability of clinically significant portal hypertension
IMPRESSION: ULTRASOUND ABDOMEN:

1. Coarsened hepatic echotexture with nodular hepatic contour
suggestive of hepatic cirrhosis. No discrete hepatic lesion
visualized.

2. Gallbladder polyp measuring 3 mm, for which no further evaluation
or follow-up is necessary based on size criteria. Per consensus
guidelines, this requires no additional evaluation or specific
follow-up. This recommendation follows ACR consensus guidelines:
White Paper of the ACR Incidental findings Committee II on
Gallbladder and Biliary Findings. [HOSPITAL] 5132:;[DATE].

ULTRASOUND HEPATIC ELASTOGRAPHY:

Median kPa:

Diagnostic category: >9 kPa and ?13 kPa: suggestive of cACLD, but
needs further testing

## 2023-04-05 ENCOUNTER — Ambulatory Visit: Payer: Self-pay | Admitting: Internal Medicine

## 2023-08-11 ENCOUNTER — Ambulatory Visit (HOSPITAL_COMMUNITY)
Admission: RE | Admit: 2023-08-11 | Discharge: 2023-08-11 | Disposition: A | Discharge status: Home / Self Care | Payer: Self-pay | Source: Ambulatory Visit | Attending: Internal Medicine | Admitting: Internal Medicine

## 2023-08-11 ENCOUNTER — Ambulatory Visit (HOSPITAL_COMMUNITY): Admission: RE | Admit: 2023-08-11 | Payer: Self-pay | Source: Ambulatory Visit

## 2023-08-11 DIAGNOSIS — K7460 Unspecified cirrhosis of liver: Secondary | ICD-10-CM | POA: Insufficient documentation

## 2023-08-11 MED ORDER — GADOBUTROL 1 MMOL/ML IV SOLN
9.0000 mL | Freq: Once | INTRAVENOUS | Status: AC | PRN
  Administered 2023-08-11: 9 mL via INTRAVENOUS

## 2023-10-15 ENCOUNTER — Ambulatory Visit: Payer: Self-pay | Admitting: Internal Medicine

## 2023-10-20 IMAGING — US US ABDOMEN COMPLETE W/ ELASTOGRAPHY
1 series · 12 of 25 positions shown · non-contrast
Comparison: 10/14/2020

CLINICAL DATA: Hepatitis C viral infection

EXAM:
ULTRASOUND ABDOMEN
ULTRASOUND HEPATIC ELASTOGRAPHY
TECHNIQUE: Sonography of the upper abdomen was performed. In addition,
ultrasound elastography evaluation of the liver was performed. A
region of interest was placed within the right lobe of the liver.
Following application of a compressive sonographic pulse, tissue
compressibility was assessed. Multiple assessments were performed at
the selected site. Median tissue compressibility was determined.
Previously, hepatic stiffness was assessed by shear wave velocity.
Based on recently published [employer] in [employer]trasound
consensus article, reporting is now recommended to be performed in
the SI units of pressure (kiloPascals) representing hepatic
stiffness/elasticity. The obtained result is compared to the
published reference standards. (cACLD = compensated Advanced Chronic
Liver Disease)

[Series 1: us abdomen complete w/elastography · 12 of 127 slices shown]
[im 6/127]
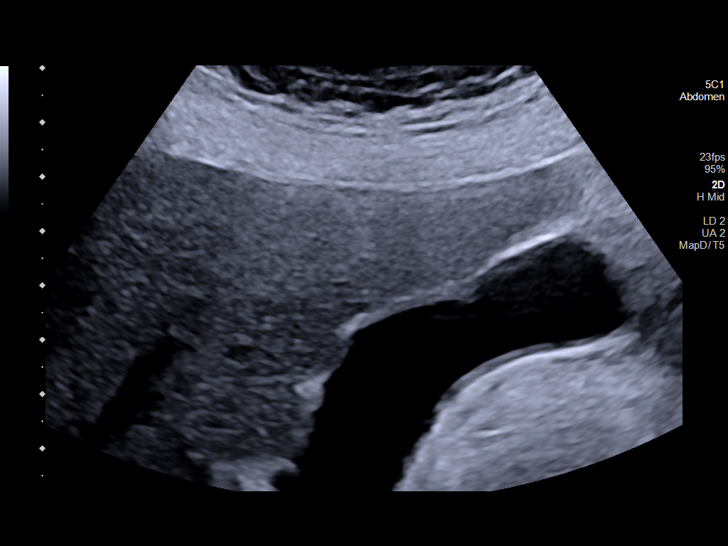
[im 16/127]
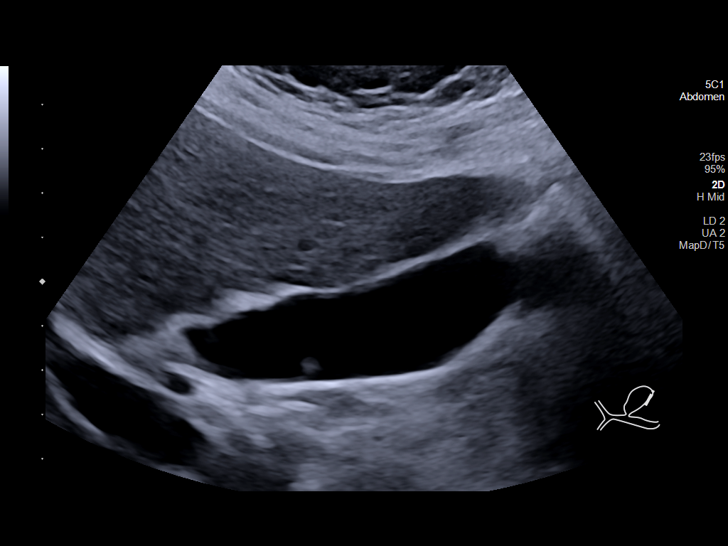
[im 27/127]
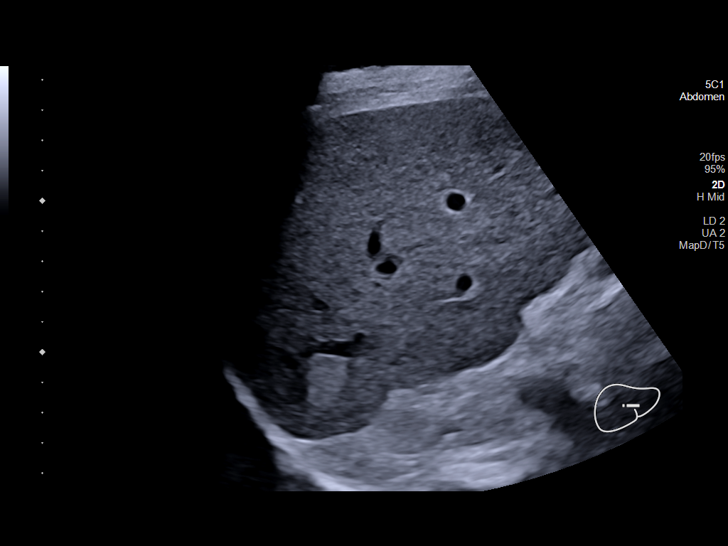
[im 37/127]
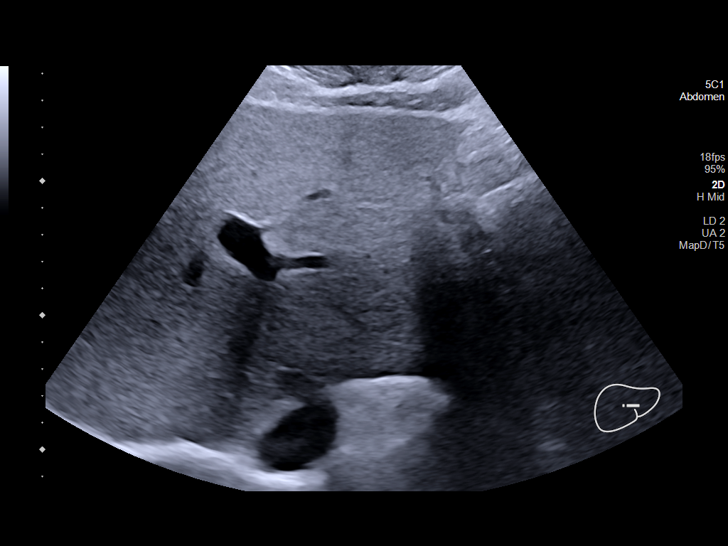
[im 48/127]
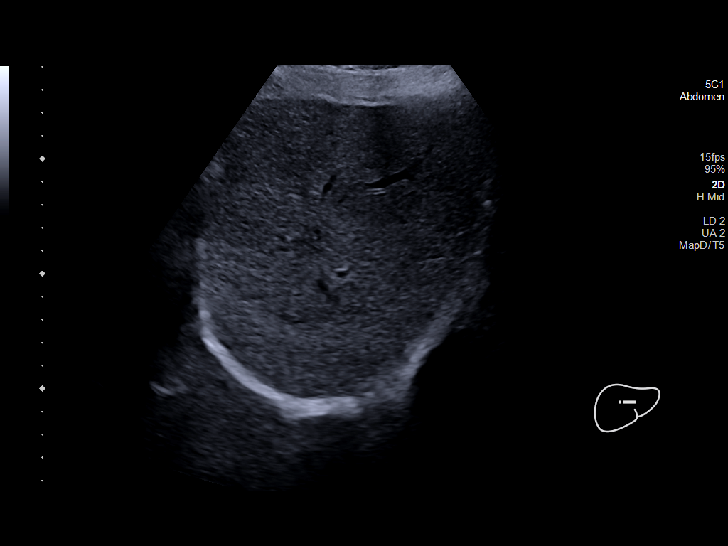
[im 58/127]
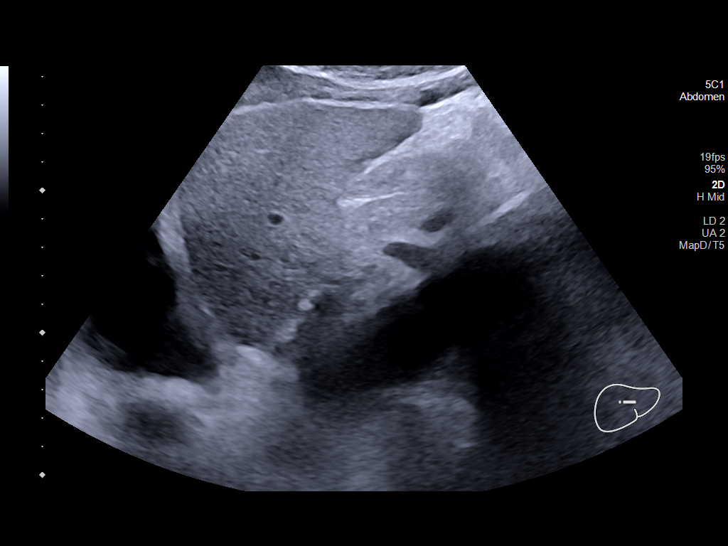
[im 69/127]
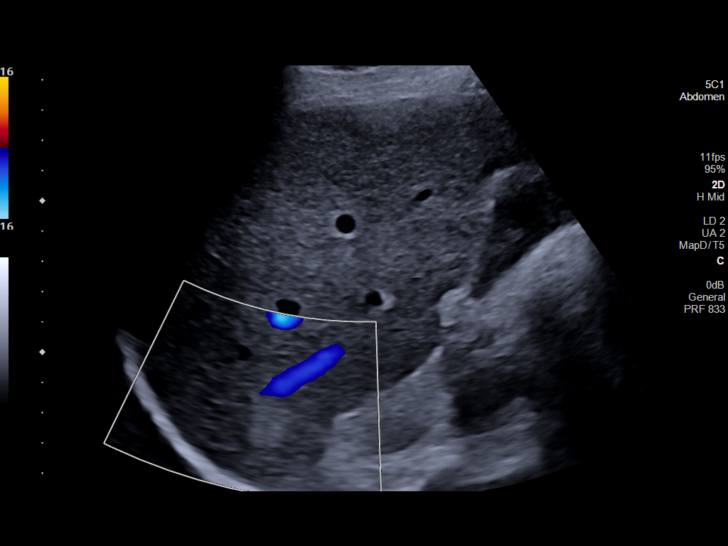
[im 79/127]
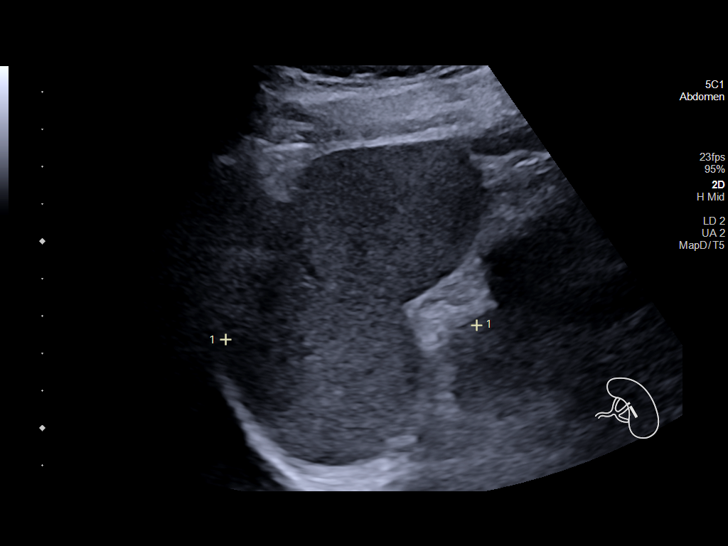
[im 90/127]
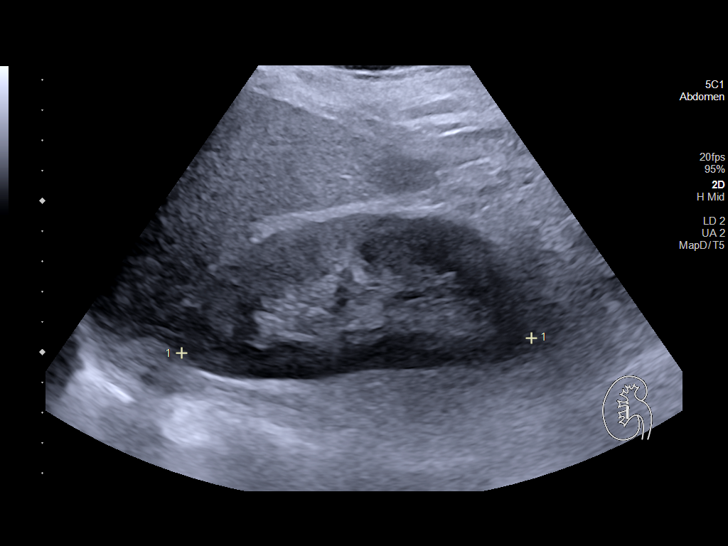
[im 100/127]
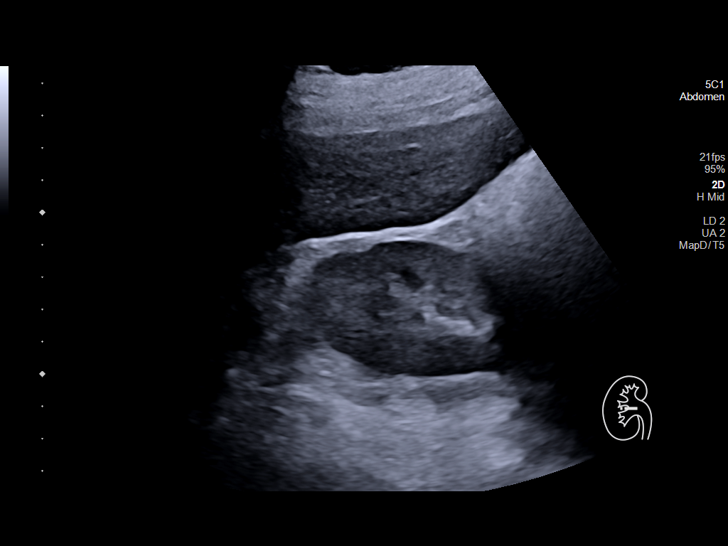
[im 111/127]
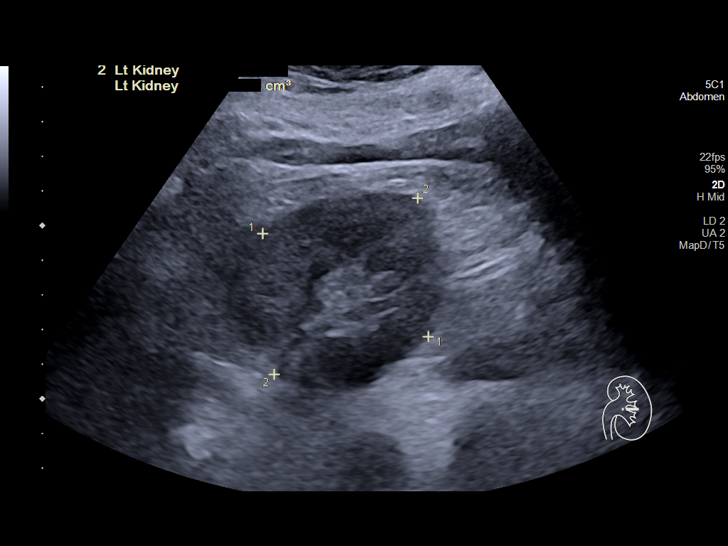
[im 121/127]
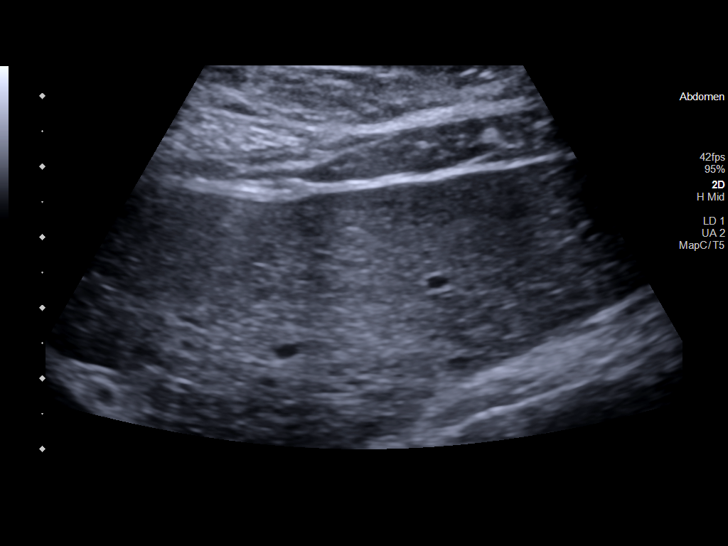

[12 of 25 positions shown; findings below may reference images not displayed]

FINDINGS: ULTRASOUND ABDOMEN

Gallbladder: Normally distended without stones or wall thickening.
Small polyps 5 mm and 2 mm diameter. No pericholecystic fluid or
sonographic Murphy sign.

Common bile duct: Diameter: 4 mm, normal

Liver: Normal parenchymal echogenicity. Question minimally nodular
hepatic margins. Hyperechoic mass posterior RIGHT lobe 15 x 24 x 12
mm question hemangioma. This was not seen on the previous exam but
this area is better visualized on the current study. Portal vein is
patent on color Doppler imaging with normal direction of blood flow
towards the liver.

IVC: Normal appearance

Pancreas: Normal appearance

Spleen: Normal appearance, 6.7 cm length

Right Kidney: Length: 11.6 cm. Normal morphology without mass or
hydronephrosis.

Left Kidney: Length: 12.9 cm. Normal morphology without mass or
hydronephrosis.

Abdominal aorta: Normal caliber

Other findings: No free fluid

ULTRASOUND HEPATIC ELASTOGRAPHY

Device: Siemens Helix VTQ

Patient position: Not recorded

Transducer [email or web address]

Number of measurements: 15

Hepatic segment:  8

Median kPa:

IQR:

IQR/Median kPa ratio:

Data quality:  Good

Diagnostic category: < or = 9 kPa: in the absence of other known
clinical signs, rules out cACLD

The use of hepatic elastography is applicable to patients with viral
hepatitis and non-alcoholic fatty liver disease. At this time, there
is insufficient data for the referenced cut-off values and use in
other causes of liver disease, including alcoholic liver disease.
Patients, however, may be assessed by elastography and serve as
their own reference standard/baseline.

In patients with non-alcoholic liver disease, the values suggesting
compensated advanced chronic liver disease (cACLD) may be lower, and
patients may need additional testing with elasticity results of [DATE]
kPa.

Please note that abnormal hepatic elasticity and shear wave
velocities may also be identified in clinical settings other than
with hepatic fibrosis, such as: acute hepatitis, elevated right
heart and central venous pressures including use of beta blockers,
Phuong disease (Pantoja), infiltrative processes such as
mastocytosis/amyloidosis/infiltrative tumor/lymphoma, extrahepatic
cholestasis, with hyperemia in the post-prandial state, and with
liver transplantation. Correlation with patient history, laboratory
data, and clinical condition recommended.

Diagnostic Categories:

< or =5 kPa: high probability of being normal

< or =9 kPa: in the absence of other known clinical signs, rules [DATE] kPa and ?13 kPa: suggestive of cACLD, but needs further testing

>13 kPa: highly suggestive of cACLD

> or =17 kPa: highly suggestive of cACLD with an increased
probability of clinically significant portal hypertension
IMPRESSION: ULTRASOUND ABDOMEN:

Small gallbladder polyp; no follow-up imaging recommended.

Probable hepatic hemangioma 24 mm greatest diameter but not
definitely seen on previous exam; confirmation by MR imaging with
and without contrast recommended.

Question mildly nodular hepatic margins without additional mass.

ULTRASOUND HEPATIC ELASTOGRAPHY:

Median kPa:

Diagnostic category: < or = 9 kPa: in the absence of other known
clinical signs, rules out cACLD

## 2023-11-26 ENCOUNTER — Encounter: Payer: Self-pay | Admitting: Oncology
